# Patient Record
Sex: Male | Born: 1950 | Race: White | Hispanic: No | Marital: Married | Smoking: Never smoker
Health system: Southern US, Community
[De-identification: ages and names within clinical notes are randomized; demographics above are authoritative.]

## PROBLEM LIST (undated history)

## (undated) DIAGNOSIS — G4733 Obstructive sleep apnea (adult) (pediatric): Secondary | ICD-10-CM

## (undated) DIAGNOSIS — E119 Type 2 diabetes mellitus without complications: Secondary | ICD-10-CM

## (undated) DIAGNOSIS — F32A Depression, unspecified: Secondary | ICD-10-CM

## (undated) DIAGNOSIS — F329 Major depressive disorder, single episode, unspecified: Secondary | ICD-10-CM

## (undated) DIAGNOSIS — I2510 Atherosclerotic heart disease of native coronary artery without angina pectoris: Secondary | ICD-10-CM

## (undated) DIAGNOSIS — E785 Hyperlipidemia, unspecified: Secondary | ICD-10-CM

## (undated) DIAGNOSIS — I10 Essential (primary) hypertension: Secondary | ICD-10-CM

## (undated) HISTORY — DX: Type 2 diabetes mellitus without complications: E11.9

## (undated) HISTORY — PX: HERNIA REPAIR: SHX51

## (undated) HISTORY — DX: Atherosclerotic heart disease of native coronary artery without angina pectoris: I25.10

## (undated) HISTORY — DX: Major depressive disorder, single episode, unspecified: F32.9

## (undated) HISTORY — DX: Essential (primary) hypertension: I10

## (undated) HISTORY — DX: Depression, unspecified: F32.A

## (undated) HISTORY — DX: Hyperlipidemia, unspecified: E78.5

## (undated) HISTORY — DX: Obstructive sleep apnea (adult) (pediatric): G47.33

---

## 2010-01-17 ENCOUNTER — Inpatient Hospital Stay (HOSPITAL_COMMUNITY)
Admission: EM | Admit: 2010-01-17 | Discharge: 2010-01-19 | Payer: Self-pay | Source: Home / Self Care | Attending: Cardiology | Admitting: Cardiology

## 2010-01-17 DIAGNOSIS — I214 Non-ST elevation (NSTEMI) myocardial infarction: Secondary | ICD-10-CM | POA: Insufficient documentation

## 2010-01-18 ENCOUNTER — Ambulatory Visit
Admission: AD | Admit: 2010-01-18 | Discharge: 2010-01-18 | Payer: Self-pay | Source: Home / Self Care | Admitting: Cardiology

## 2010-01-18 HISTORY — PX: OTHER SURGICAL HISTORY: SHX169

## 2010-01-20 DIAGNOSIS — E119 Type 2 diabetes mellitus without complications: Secondary | ICD-10-CM | POA: Insufficient documentation

## 2010-01-20 DIAGNOSIS — E785 Hyperlipidemia, unspecified: Secondary | ICD-10-CM | POA: Insufficient documentation

## 2010-01-20 DIAGNOSIS — G4700 Insomnia, unspecified: Secondary | ICD-10-CM | POA: Insufficient documentation

## 2010-01-20 DIAGNOSIS — I10 Essential (primary) hypertension: Secondary | ICD-10-CM | POA: Insufficient documentation

## 2010-01-20 DIAGNOSIS — E291 Testicular hypofunction: Secondary | ICD-10-CM | POA: Insufficient documentation

## 2010-01-20 DIAGNOSIS — I2510 Atherosclerotic heart disease of native coronary artery without angina pectoris: Secondary | ICD-10-CM | POA: Insufficient documentation

## 2010-01-24 ENCOUNTER — Encounter: Payer: Self-pay | Admitting: Physician Assistant

## 2010-01-24 ENCOUNTER — Encounter: Payer: Self-pay | Admitting: Cardiovascular Disease

## 2010-01-24 ENCOUNTER — Telehealth: Payer: Self-pay | Admitting: Physician Assistant

## 2010-01-24 DIAGNOSIS — IMO0002 Reserved for concepts with insufficient information to code with codable children: Secondary | ICD-10-CM | POA: Insufficient documentation

## 2010-01-31 ENCOUNTER — Ambulatory Visit: Payer: Self-pay | Admitting: Physician Assistant

## 2010-01-31 ENCOUNTER — Encounter (HOSPITAL_COMMUNITY)
Admission: RE | Admit: 2010-01-31 | Discharge: 2010-03-15 | Payer: Self-pay | Source: Home / Self Care | Attending: Cardiovascular Disease | Admitting: Cardiovascular Disease

## 2010-02-03 ENCOUNTER — Telehealth (INDEPENDENT_AMBULATORY_CARE_PROVIDER_SITE_OTHER): Payer: Self-pay | Admitting: *Deleted

## 2010-02-03 ENCOUNTER — Encounter (HOSPITAL_COMMUNITY)
Admission: RE | Admit: 2010-02-03 | Discharge: 2010-03-15 | Payer: Self-pay | Source: Home / Self Care | Attending: Cardiovascular Disease | Admitting: Cardiovascular Disease

## 2010-02-08 ENCOUNTER — Ambulatory Visit: Payer: Self-pay | Admitting: Cardiovascular Disease

## 2010-02-09 ENCOUNTER — Encounter: Payer: Self-pay | Admitting: Cardiovascular Disease

## 2010-02-11 ENCOUNTER — Encounter: Payer: Self-pay | Admitting: Cardiovascular Disease

## 2010-02-15 ENCOUNTER — Ambulatory Visit
Admission: RE | Admit: 2010-02-15 | Discharge: 2010-02-15 | Payer: Self-pay | Source: Home / Self Care | Attending: Cardiovascular Disease | Admitting: Cardiovascular Disease

## 2010-02-18 LAB — GLUCOSE, CAPILLARY
Glucose-Capillary: 161 mg/dL — ABNORMAL HIGH (ref 70–99)
Glucose-Capillary: 119 mg/dL — ABNORMAL HIGH (ref 70–99)

## 2010-02-22 ENCOUNTER — Ambulatory Visit
Admission: RE | Admit: 2010-02-22 | Discharge: 2010-02-22 | Payer: Self-pay | Source: Home / Self Care | Attending: Cardiovascular Disease | Admitting: Cardiovascular Disease

## 2010-03-03 ENCOUNTER — Encounter: Payer: Self-pay | Admitting: Cardiovascular Disease

## 2010-03-03 ENCOUNTER — Ambulatory Visit
Admission: RE | Admit: 2010-03-03 | Discharge: 2010-03-03 | Payer: Self-pay | Source: Home / Self Care | Attending: Cardiovascular Disease | Admitting: Cardiovascular Disease

## 2010-03-08 ENCOUNTER — Ambulatory Visit
Admission: RE | Admit: 2010-03-08 | Discharge: 2010-03-08 | Payer: Self-pay | Source: Home / Self Care | Attending: Cardiovascular Disease | Admitting: Cardiovascular Disease

## 2010-03-08 ENCOUNTER — Encounter: Payer: Self-pay | Admitting: Cardiovascular Disease

## 2010-03-15 ENCOUNTER — Ambulatory Visit: Admit: 2010-03-15 | Payer: Self-pay | Admitting: Cardiovascular Disease

## 2010-03-15 ENCOUNTER — Inpatient Hospital Stay (HOSPITAL_COMMUNITY)
Admission: EM | Admit: 2010-03-15 | Discharge: 2010-03-16 | DRG: 122 | Disposition: A | Discharge status: Home / Self Care | Payer: BC Managed Care – HMO | Attending: Internal Medicine | Admitting: Internal Medicine

## 2010-03-15 ENCOUNTER — Encounter (INDEPENDENT_AMBULATORY_CARE_PROVIDER_SITE_OTHER): Payer: Self-pay | Admitting: *Deleted

## 2010-03-15 ENCOUNTER — Telehealth: Payer: Self-pay | Admitting: Cardiovascular Disease

## 2010-03-15 DIAGNOSIS — Z9861 Coronary angioplasty status: Secondary | ICD-10-CM

## 2010-03-15 DIAGNOSIS — I10 Essential (primary) hypertension: Secondary | ICD-10-CM | POA: Diagnosis present

## 2010-03-15 DIAGNOSIS — E669 Obesity, unspecified: Secondary | ICD-10-CM | POA: Diagnosis present

## 2010-03-15 DIAGNOSIS — E785 Hyperlipidemia, unspecified: Secondary | ICD-10-CM | POA: Diagnosis present

## 2010-03-15 DIAGNOSIS — I2510 Atherosclerotic heart disease of native coronary artery without angina pectoris: Secondary | ICD-10-CM | POA: Diagnosis present

## 2010-03-15 DIAGNOSIS — Z7982 Long term (current) use of aspirin: Secondary | ICD-10-CM

## 2010-03-15 DIAGNOSIS — E119 Type 2 diabetes mellitus without complications: Secondary | ICD-10-CM | POA: Diagnosis present

## 2010-03-15 DIAGNOSIS — I252 Old myocardial infarction: Secondary | ICD-10-CM

## 2010-03-15 DIAGNOSIS — I214 Non-ST elevation (NSTEMI) myocardial infarction: Principal | ICD-10-CM | POA: Diagnosis present

## 2010-03-15 LAB — CK TOTAL AND CKMB (NOT AT ARMC)
CK, MB: 5 ng/mL — ABNORMAL HIGH (ref 0.3–4.0)
Relative Index: 3 — ABNORMAL HIGH (ref 0.0–2.5)
Total CK: 168 U/L (ref 7–232)

## 2010-03-15 LAB — POCT CARDIAC MARKERS
CKMB, poc: 1.5 ng/mL (ref 1.0–8.0)
CKMB, poc: 1.7 ng/mL (ref 1.0–8.0)
Myoglobin, poc: 58.5 ng/mL (ref 12–200)
Myoglobin, poc: 59.8 ng/mL (ref 12–200)
Troponin i, poc: <0.05 ng/mL (ref 0.00–0.09)
Troponin i, poc: <0.05 ng/mL (ref 0.00–0.09)

## 2010-03-15 LAB — DIFFERENTIAL
Basophils Absolute: 0 10*3/uL (ref 0.0–0.1)
Basophils Relative: 0 % (ref 0–1)
Eosinophils Absolute: 0.1 10*3/uL (ref 0.0–0.7)
Eosinophils Relative: 1 % (ref 0–5)
Lymphocytes Relative: 25 % (ref 12–46)
Lymphs Abs: 2.2 10*3/uL (ref 0.7–4.0)
Monocytes Absolute: 0.7 10*3/uL (ref 0.1–1.0)
Monocytes Relative: 8 % (ref 3–12)
Neutro Abs: 5.8 10*3/uL (ref 1.7–7.7)
Neutrophils Relative %: 65 % (ref 43–77)

## 2010-03-15 LAB — TROPONIN I: Troponin I: 0.11 ng/mL — ABNORMAL HIGH (ref 0.00–0.06)

## 2010-03-15 LAB — URINALYSIS, ROUTINE W REFLEX MICROSCOPIC
Bilirubin Urine: NEGATIVE
Hgb urine dipstick: NEGATIVE
Ketones, ur: NEGATIVE mg/dL
Leukocytes, UA: NEGATIVE
Nitrite: NEGATIVE
Protein, ur: 30 mg/dL — AB
Specific Gravity, Urine: 1.011 (ref 1.005–1.030)
Urine Glucose, Fasting: NEGATIVE mg/dL
Urobilinogen, UA: 0.2 mg/dL (ref 0.0–1.0)
pH: 7.5 (ref 5.0–8.0)

## 2010-03-15 LAB — CBC
HCT: 44.3 % (ref 39.0–52.0)
Hemoglobin: 14.3 g/dL (ref 13.0–17.0)
MCH: 28 pg (ref 26.0–34.0)
MCHC: 32.3 g/dL (ref 30.0–36.0)
MCV: 86.9 fL (ref 78.0–100.0)
Platelets: 187 10*3/uL (ref 150–400)
RBC: 5.1 MIL/uL (ref 4.22–5.81)
RDW: 13.5 % (ref 11.5–15.5)
WBC: 8.8 10*3/uL (ref 4.0–10.5)

## 2010-03-15 LAB — URINE MICROSCOPIC-ADD ON

## 2010-03-15 LAB — COMPREHENSIVE METABOLIC PANEL
ALT: 49 U/L (ref 0–53)
AST: 26 U/L (ref 0–37)
Albumin: 3.7 g/dL (ref 3.5–5.2)
Alkaline Phosphatase: 55 U/L (ref 39–117)
BUN: 7 mg/dL (ref 6–23)
CO2: 27 mEq/L (ref 19–32)
Calcium: 9.1 mg/dL (ref 8.4–10.5)
Chloride: 105 mEq/L (ref 96–112)
Creatinine, Ser: 0.67 mg/dL (ref 0.4–1.5)
GFR calc Af Amer: >60 mL/min (ref >60)
GFR calc non Af Amer: >60 mL/min (ref >60)
Glucose, Bld: 106 mg/dL — ABNORMAL HIGH (ref 70–99)
Potassium: 3.9 mEq/L (ref 3.5–5.1)
Sodium: 138 mEq/L (ref 135–145)
Total Bilirubin: 0.6 mg/dL (ref 0.3–1.2)
Total Protein: 6.3 g/dL (ref 6.0–8.3)

## 2010-03-15 LAB — PROTIME-INR
INR: 1.08 (ref 0.00–1.49)
INR: 1.16 (ref 0.00–1.49)
Prothrombin Time: 14.2 seconds (ref 11.6–15.2)
Prothrombin Time: 15 seconds (ref 11.6–15.2)

## 2010-03-15 LAB — GLUCOSE, CAPILLARY
Glucose-Capillary: 75 mg/dL (ref 70–99)
Glucose-Capillary: 136 mg/dL — ABNORMAL HIGH (ref 70–99)
Glucose-Capillary: 119 mg/dL — ABNORMAL HIGH (ref 70–99)

## 2010-03-15 LAB — TSH: TSH: 1.051 u[IU]/mL (ref 0.350–4.500)

## 2010-03-15 LAB — CARDIAC PANEL(CRET KIN+CKTOT+MB+TROPI)
CK, MB: 5 ng/mL — ABNORMAL HIGH (ref 0.3–4.0)
CK, MB: 4 ng/mL (ref 0.3–4.0)
Relative Index: 3.2 — ABNORMAL HIGH (ref 0.0–2.5)
Relative Index: 3 — ABNORMAL HIGH (ref 0.0–2.5)
Total CK: 156 U/L (ref 7–232)
Total CK: 132 U/L (ref 7–232)
Troponin I: 0.13 ng/mL — ABNORMAL HIGH (ref 0.00–0.06)
Troponin I: 0.09 ng/mL — ABNORMAL HIGH (ref 0.00–0.06)

## 2010-03-15 LAB — APTT
aPTT: 31 seconds (ref 24–37)
aPTT: 92 seconds — ABNORMAL HIGH (ref 24–37)

## 2010-03-15 LAB — HEMOGLOBIN A1C
Hgb A1c MFr Bld: 6.2 % — ABNORMAL HIGH (ref <5.7)
Mean Plasma Glucose: 131 mg/dL — ABNORMAL HIGH (ref <117)

## 2010-03-15 LAB — HEPARIN LEVEL (UNFRACTIONATED): Heparin Unfractionated: 0.15 IU/mL — ABNORMAL LOW (ref 0.30–0.70)

## 2010-03-16 ENCOUNTER — Encounter: Payer: Self-pay | Admitting: Cardiovascular Disease

## 2010-03-16 ENCOUNTER — Observation Stay (HOSPITAL_COMMUNITY): Payer: BC Managed Care – HMO

## 2010-03-16 DIAGNOSIS — I214 Non-ST elevation (NSTEMI) myocardial infarction: Secondary | ICD-10-CM

## 2010-03-16 DIAGNOSIS — I2510 Atherosclerotic heart disease of native coronary artery without angina pectoris: Secondary | ICD-10-CM

## 2010-03-16 LAB — LIPID PANEL
HDL: 31 mg/dL — ABNORMAL LOW (ref >39)
Total CHOL/HDL Ratio: 2.9 RATIO
VLDL: 22 mg/dL (ref 0–40)

## 2010-03-16 LAB — COMPREHENSIVE METABOLIC PANEL
ALT: 42 U/L (ref 0–53)
AST: 24 U/L (ref 0–37)
Albumin: 3.5 g/dL (ref 3.5–5.2)
CO2: 26 mEq/L (ref 19–32)
Calcium: 8.8 mg/dL (ref 8.4–10.5)
GFR calc Af Amer: >60 mL/min (ref >60)
Sodium: 140 mEq/L (ref 135–145)
Total Protein: 6.1 g/dL (ref 6.0–8.3)

## 2010-03-16 LAB — CBC
MCH: 26.7 pg (ref 26.0–34.0)
MCV: 88.3 fL (ref 78.0–100.0)
Platelets: 177 10*3/uL (ref 150–400)
RBC: 4.95 MIL/uL (ref 4.22–5.81)

## 2010-03-16 LAB — CARDIAC PANEL(CRET KIN+CKTOT+MB+TROPI)
CK, MB: 2.9 ng/mL (ref 0.3–4.0)
Relative Index: INVALID (ref 0.0–2.5)
Total CK: 93 U/L (ref 7–232)

## 2010-03-16 LAB — GLUCOSE, CAPILLARY: Glucose-Capillary: 110 mg/dL — ABNORMAL HIGH (ref 70–99)

## 2010-03-17 LAB — POCT ACTIVATED CLOTTING TIME: Activated Clotting Time: 228 seconds

## 2010-03-17 NOTE — Progress Notes (Signed)
Summary: sore and bruising at cath site  Phone Note Call from Patient Call back at Home Phone [phone] Call back at [phone]   Caller: Patient Summary of Call: Pt is sore and having bruising at cath site Initial call taken by: Judie Grieve,  January 24, 2010 2:15 PM  Follow-up for Phone Call        Pt. stated that his hematoma  on his right wrist has gotten larger since he was discharged. He states that it is firm to the touch. It is slightly painful but very sore. I told him that it was probably okay but since he is supposed to see the PA 12/19 then we could bring him in the office today for that f.u visit so he can eval. his cath site. Pt. will come into the office today @ 3pm to see Tereso Newcomer. Whitney Maeola Sarah RN  January 24, 2010 2:29 PM  Follow-up by: Whitney Maeola Sarah RN,  January 24, 2010 2:30 PM

## 2010-03-17 NOTE — Progress Notes (Signed)
Summary: Records Request  Faxed OV & EKG to [facility] ([phone]). Debby Freiberg  February 03, 2010 10:16 AM

## 2010-03-17 NOTE — Miscellaneous (Signed)
Summary: [facility] Cardiac Progress Report    Cardiac Progress Report   Imported By: Roderic Ovens 03/01/2010 13:31:45  _____________________________________________________________________  External Attachment:    Type:   Image     Comment:   External Document

## 2010-03-17 NOTE — Assessment & Plan Note (Signed)
Summary: EPH .wa   Visit Type:  EPH Primary Provider:  Tally Joe, MD  CC:  hematoma on right hand....denies any other complaints today.  History of Present Illness: Primary Cardiologist:  Dr. [name]  [name] is a 60 yo male with a h/o DM2, HTN and HLP who presented to [facility] 12/5 with a NSTEMI.  He had a 95% proximal LAD lesion treated with a Promus DES.  There was 80% D1 stenosis prior to PCI and the Dx remained patent post stenting.  He had mild to mod residual stenosis in the CFX and RCA.  EF was preserved at 60%.  He was due for an appt to f/u next week.  He called in with concerns at his right radial site and was added on to my schedule.  He noted some bruising in his right wrist yesterday with some mild discomfort with light touch only over this area.  He denies severe pain, cold fingers or numbness.  He denies bleeding or discharge.  He denies chest pain, dyspnea, syncope, palpitations, orhtopnea or PND or edema.  He has not yet been contacted for cardiac rehab.   Current Medications (verified): 1)  Aspirin 81 Mg Tabs (Aspirin) .... Take 1 Tablet By Mouth Once A Day 2)  Lisinopril 5 Mg Tabs (Lisinopril) .... Take 1 Tablet By Mouth Once A Day 3)  Metoprolol Tartrate 50 Mg Tabs (Metoprolol Tartrate) .... Take 1 Tablet By Mouth Two Times A Day 4)  Nitrostat 0.4 Mg Subl (Nitroglycerin) .... Take One Under Tongue As Needed For Chest Pain; May Repeat Every 5 Mins X 2 5)  Effient 10 Mg Tabs (Prasugrel Hcl) .... Take 1 Tablet By Mouth Once A Day 6)  Crestor 40 Mg Tabs (Rosuvastatin Calcium) .... Take 1 Tablet By Mouth Once A Day For Cholesterol 7)  Ambien 10 Mg Tabs (Zolpidem Tartrate) .... Take 1 Tab By Mouth At Bedtime As Needed 8)  Androgel Pump 1.25 Gm/act (1%) Gel (Testosterone) .... As Directed 9)  Fish Oil 1000 Mg Caps (Omega-3 Fatty Acids) .Marland Kitchen.. 1 Cap Once Daily 10)  Multivitamins  Tabs (Multiple Vitamin) .... Take 1 Tablet By Mouth Once A Day 11)  Penlac  8 % Soln (Ciclopirox) .... Apply As Directed Once Daily As Needed  Allergies (verified): No Known Drug Allergies  Past History:  Past Medical History: Reviewed history from 01/20/2010 and no changes required. CAD   a.  s/p NSTEMI 12.5.2011: Tx with Promus DES to prox. LAD   b.  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   He denies any tobacco or drug use.  He may have one alcoholic drink per week.   Review of Systems       As per  the HPI.  All other systems reviewed and negative.   Vital Signs:  Patient profile:   60 year old male Height:      71 inches Weight:      267 pounds BMI:     37.37 Pulse rate:   82 / minute Pulse rhythm:   irregular Resp:     16 per minute BP sitting:   134 / 83  (left arm) Cuff size:   large  Vitals Entered By: Danielle Rankin, CMA (January 24, 2010 3:27 PM)  Physical Exam  General:  Well nourished, well developed, in no acute  distress HEENT: normal Neck: no JVD Cardiac:  normal S1, S2; RRR; no murmur Lungs:  clear to auscultation bilaterally, no wheezing, rhonchi or rales Abd: soft, nontender, no hepatomegaly Ext: no edema; R radial site with small hematoma; no bruit Vascular: no carotid  bruits; right radial pulse 2+; cap refill in right hand < 1 sec. Skin: warm and dry Neuro:  CNs 2-12 intact, no focal abnormalities noted    EKG  Procedure date:  01/24/2010  Findings:      Normal Sinus Rhythm Heart rate 82 Normal axis Insignificant Q wave in lead 3  Poor R wave progression Nonspecific ST-T wave changes  Impression & Recommendations:  Problem # 1:  ACUT MI SUBENDOCARDIAL INFARCT EPIS CARE UNS (ICD-410.70)  Doing well post NSTEMI. We will make sure he gets set up for cardiac rehab. He will try to go back to work at 1/2 time and increase as tolerated.  Problem # 2:  CORONARY  ATHEROSCLEROSIS NATIVE CORONARY ARTERY (ICD-414.01)  Continue ASA and Effient.  I explained the importance of remaining on dual anti-platelet therapy.  Problem # 3:  ESSENTIAL HYPERTENSION, BENIGN (ICD-401.1)  Would like to see his HR lower. Will increase his metoprolol to 50 mg 1 1/2 tablets two times a day.  Problem # 4:  HYPERLIPIDEMIA (ICD-272.4) Check FLP and LFTs at follow up visit.  His updated medication list for this problem includes:    Crestor 40 Mg Tabs (Rosuvastatin calcium) .Marland Kitchen... Take 1 tablet by mouth once a day for cholesterol  Problem # 5:  DIABETES MELLITUS, TYPE II (ICD-250.00) F/u with PCP.  Problem # 6:  HEMATOMA COMPLICATING A PROCEDURE NEC (ZOX-096.04) He has a small hematoma over the radial site with surrounding ecchymoses.  This appears stable without evidence of pseudoaneurysm. Dr. Excell Seltzer also looked at his wrist today. He will continue to monitor and follow up as needed.  Patient Instructions: 1)  Your physician recommends that you schedule a follow-up appointment in: 6 weeks with Dr. Excell Seltzer. 2)  Your physician recommends that you return for a FASTING lipid profile:as well a liver panel in 6 weeks when you return for your visit with Dr. Excell Seltzer 3)  Your physician has recommended you make the following change in your medication: START TTAKING METOPROLOL TART. 50 MG 1 1/2 TAB two times a day NEW RX SENT IN TODAY. Prescriptions: METOPROLOL TARTRATE 50 MG TABS (METOPROLOL TARTRATE) Take 1 and 1/2  tablets by mouth two times a day  #90 x 5   Entered and Authorized by:   Tereso Newcomer PA-C   Signed by:   Tereso Newcomer PA-C on 01/24/2010   Method used:   Electronically to        [facility]  [phone]* (retail)       [address]       [place], [place]  [address]       Ph: [phone] or [phone]       Fax: [phone]   RxID:   [id number]

## 2010-03-17 NOTE — Miscellaneous (Signed)
Summary: [facility] Physician Order/Treatment Plan   [facility] Physician Order/Treatment Plan   Imported By: Roderic Ovens 02/08/2010 16:34:28  _____________________________________________________________________  External Attachment:    Type:   Image     Comment:   External Document

## 2010-03-17 NOTE — Letter (Signed)
Summary: [facility]  Cardiac & Pulm Rehab   Imported By: Marylou Mccoy 02/23/2010 17:21:59  _____________________________________________________________________  External Attachment:    Type:   Image     Comment:   External Document

## 2010-03-18 ENCOUNTER — Ambulatory Visit: Payer: Self-pay | Admitting: Cardiovascular Disease

## 2010-03-18 ENCOUNTER — Ambulatory Visit: Admit: 2010-03-18 | Payer: Self-pay | Admitting: Cardiovascular Disease

## 2010-03-18 ENCOUNTER — Encounter (HOSPITAL_COMMUNITY): Payer: BC Managed Care – HMO

## 2010-03-21 ENCOUNTER — Encounter (HOSPITAL_COMMUNITY): Payer: BC Managed Care – HMO | Attending: Cardiovascular Disease

## 2010-03-21 DIAGNOSIS — E785 Hyperlipidemia, unspecified: Secondary | ICD-10-CM | POA: Insufficient documentation

## 2010-03-21 DIAGNOSIS — I214 Non-ST elevation (NSTEMI) myocardial infarction: Secondary | ICD-10-CM | POA: Insufficient documentation

## 2010-03-21 DIAGNOSIS — I2510 Atherosclerotic heart disease of native coronary artery without angina pectoris: Secondary | ICD-10-CM | POA: Insufficient documentation

## 2010-03-21 DIAGNOSIS — Z7982 Long term (current) use of aspirin: Secondary | ICD-10-CM | POA: Insufficient documentation

## 2010-03-21 DIAGNOSIS — I10 Essential (primary) hypertension: Secondary | ICD-10-CM | POA: Insufficient documentation

## 2010-03-21 DIAGNOSIS — E119 Type 2 diabetes mellitus without complications: Secondary | ICD-10-CM | POA: Insufficient documentation

## 2010-03-21 DIAGNOSIS — Z9861 Coronary angioplasty status: Secondary | ICD-10-CM | POA: Insufficient documentation

## 2010-03-21 DIAGNOSIS — Z5189 Encounter for other specified aftercare: Secondary | ICD-10-CM | POA: Insufficient documentation

## 2010-03-23 ENCOUNTER — Encounter (HOSPITAL_COMMUNITY): Payer: BC Managed Care – HMO

## 2010-03-23 ENCOUNTER — Other Ambulatory Visit: Payer: Self-pay

## 2010-03-23 NOTE — Progress Notes (Signed)
Summary: pt had chest pain in the last week  Phone Note Call from Patient Call back at [phone]   Caller: Patient Reason for Call: Talk to Nurse, Talk to Doctor Summary of Call: pt had chest pain in the last week chills and SOB and elevated b/p. He has an appt Friday with Dr. Copper but was wondering if he needed to be seen sooner.  Follow-up for Phone Call        Spoke with pt who reports chest pain  4-5 different times in past week.  Pt usually starts with shaking chills and then has chest pain. Describes pain as similiar to when he had MI but less intense. Pain located in central chest area. He does report some SOB with pain.  Not sure if he has a fever but has had persistent cough for last week or so.  States blood pressure was elevated yesterday at Cardiac Rehab--150/80-90.  This AM blood pressure is 159/90.  Has appt. this Friday with Dr. Excell Seltzer. Will discuss with Tereso Newcomer, PA who saw pt in December. Follow-up by: Dossie Arbour, RN, BSN,  March 15, 2010 8:36 AM  Additional Follow-up for Phone Call Additional follow up Details #1::        Recommend he go to ED with symptoms that remind him of his recent MI. Additional Follow-up by: Tereso Newcomer PA-C,  March 15, 2010 8:43 AM    Additional Follow-up for Phone Call Additional follow up Details #2::    Pt. given recommendations per Tereso Newcomer, PA.  He is not having pain at present and lives 5 minutes from hospital so wife will take him.   Will notify ED. Follow-up by: Dossie Arbour, RN, BSN,  March 15, 2010 8:48 AM

## 2010-03-23 NOTE — Letter (Signed)
Summary: ER Notification  Architectural technologist, Main Office  [address]   [place], [place] [address]   Phone: [phone]  Fax: [phone]    March 15, 2010 8:50 AM  [name]  The above referenced patient has been advised to report directly to the Emergency Room. Please see below for more information:  Dx:  chest pain._     Sales executive  _____X__________ or EMS:  ________________   Orders:  Yes ______ or No  __X_____   Notify upon arrival:     Trish [phone]        Thank you,    Architectural technologist Staff

## 2010-03-25 ENCOUNTER — Encounter (HOSPITAL_COMMUNITY): Payer: BC Managed Care – HMO

## 2010-03-28 ENCOUNTER — Encounter (HOSPITAL_COMMUNITY): Payer: BC Managed Care – HMO

## 2010-03-30 ENCOUNTER — Encounter (HOSPITAL_COMMUNITY): Payer: BC Managed Care – HMO

## 2010-03-31 ENCOUNTER — Encounter: Payer: Self-pay | Admitting: Physician Assistant

## 2010-03-31 ENCOUNTER — Encounter (INDEPENDENT_AMBULATORY_CARE_PROVIDER_SITE_OTHER): Payer: BC Managed Care – HMO | Admitting: Physician Assistant

## 2010-03-31 DIAGNOSIS — G4733 Obstructive sleep apnea (adult) (pediatric): Secondary | ICD-10-CM | POA: Insufficient documentation

## 2010-03-31 DIAGNOSIS — I2510 Atherosclerotic heart disease of native coronary artery without angina pectoris: Secondary | ICD-10-CM

## 2010-03-31 DIAGNOSIS — R5383 Other fatigue: Secondary | ICD-10-CM | POA: Insufficient documentation

## 2010-03-31 DIAGNOSIS — R5381 Other malaise: Secondary | ICD-10-CM | POA: Insufficient documentation

## 2010-03-31 DIAGNOSIS — R079 Chest pain, unspecified: Secondary | ICD-10-CM

## 2010-04-01 ENCOUNTER — Encounter (HOSPITAL_COMMUNITY): Payer: BC Managed Care – HMO

## 2010-04-04 ENCOUNTER — Encounter (HOSPITAL_COMMUNITY): Payer: BC Managed Care – HMO

## 2010-04-05 ENCOUNTER — Ambulatory Visit: Payer: Self-pay | Admitting: Cardiovascular Disease

## 2010-04-06 ENCOUNTER — Encounter (HOSPITAL_COMMUNITY): Payer: BC Managed Care – HMO

## 2010-04-06 NOTE — H&P (Signed)
NAME:  [name], FUSSELL NO.:  [id number]  MEDICAL RECORD NO.:  [id number]          PATIENT TYPE:  INP  LOCATION:  3742                         FACILITY:  [facility]  PHYSICIAN:  Bevelyn Buckles. Della Homan, MDDATE OF BIRTH:  October 06, 1950  DATE OF ADMISSION:  03/15/2010 DATE OF DISCHARGE:                             HISTORY & PHYSICAL   PRIMARY CARDIOLOGIST:  Veverly Fells. Excell Seltzer, MD  PRIMARY CARE PROVIDER:  Tally Joe, MD  PATIENT PROFILE:  A 60 year old male with history of non-ST-elevation MI and drug-eluting stent placement to the LAD in December 2011 presents with atypical chest pain.  PROBLEMS: 1. Chest pain/coronary artery disease.     a.     Status post non-ST-elevation myocardial infarction January 18, 2010.     b.     January 19, 2010, cardiac catheterization showing left main      normal.  Left anterior descending 95% proximal extending into the      bifurcation of the first diagonal.  First diagonal had 80% ostial      stenosis.  The second diagonal was subtotally occluded.      Circumflex had 30% mid stenosis.  Right coronary artery has a 30%      to 40% proximal stenosis and 50% distal stenosis.  Ejection      fraction was 60%.  The left anterior descending was stented with a      3.5 x 20 mm PROMUS Element plus drug-eluting stent. 2. Hypertension. 3. Hyperlipidemia. 4. Diabetes mellitus, diet controlled. 5. Overweight.  ALLERGIES:  No known drug allergies.  HISTORY OF PRESENT ILLNESS:  A 60 year old male with the above problem list.  Since his non-ST-elevation MI drug-eluting stent in December, he had been doing relatively well, working on cardiac rehab without significant limitations.  Approximately 2 weeks ago, he began to experience occasional episodes of profound chills followed by 4/10 substernal chest pressure sometimes associated with shortness of breath occurring almost exclusively at rest lasting hours at a time and resolving only once he  puts on a warm coat.  Once he is warm, chills and chest pain resolve completely.  Symptoms are similar to prior angina only and that it feels like pressure, but prior angina was left-sided chest pressure, which was more severe and associated with nausea, diaphoresis, dyspnea, and left arm radiation.  He has not had anything like that over the past few weeks.  He has probably had about 10 episodes of chills and chest discomfort over the past 2 weeks, however, has only ever had one episode of chest discomfort while at cardiac rehab and that occurred earlier this week and was improved by simply putting his right arm at his side while on the elliptical without having to reduce his level of activity.  Yesterday, the patient noted that his blood pressure was running in the 160s.  It was the same way this morning.  Because of these intermittent symptoms of chest pain, now with hypertension, he called the office this morning and was advised to present to the ED.  Currently he is pain free.  Pressures  are in the 150s.  HOME MEDICATIONS: 1. Aspirin 81 mg daily. 2. Lisinopril 5 mg daily. 3. Lopressor 75 mg b.i.d. 4. Nitroglycerin p.r.n. 5. Effient 10 mg daily. 6. Crestor 40 mg daily. 7. Ambien 10 mg at bedtime p.r.n. 8. AndroGel Pump 1.25 g as directed. 9. Fish oil 1000 mg daily. 10.Multivitamin daily. 11.Penlac 8% solution, study drug now completed.  FAMILY HISTORY:  His father had CABG in his 52s, otherwise negative for early CAD.  SOCIAL HISTORY:  The patient lives locally with his wife.  He is a Production designer, theatre/television/film at a [employer].  Denies tobacco or drug use.  He has about one drink a week.  He is exercising 3 times a week in cardiac rehab as above.  REVIEW OF SYSTEMS:  Positive for chills with associated chest pain. Occasionally has dyspnea, but overall feels better when he exercises. He has a history of diet-controlled diabetes.  He is a full code. Otherwise, all systems reviewed and  negative.  PHYSICAL EXAMINATION:  VITAL SIGNS:  Temperature 98.5, heart rate 63, respirations 20, blood pressure 152/92, pulse ox 98% on room air. GENERAL:  Pleasant white male in no acute distress.  Awake, alert and oriented x3.  Has a normal affect. HEENT:  Normal.  Nares grossly intact.  Nonfocal. SKIN:  Warm and dry without lesions or masses. NECK:  Supple without bruits or JVD. LUNGS:  Respirations are regular and unlabored.  Clear to auscultation. CARDIAC:  Regular S1 and S2.  No S3, S4, or murmurs. ABDOMEN:  Round, soft, nontender, nondistended.  Bowel sounds present x4. EXTREMITIES:  Warm, dry, and pink.  No clubbing, cyanosis, or edema. Dorsalis pedis and posterior tibial pulses 2+ and equal bilaterally.  Chest x-ray is pending.  EKG shows sinus rhythm, rate of 62, no acute ST- T changes.  Lab work is pending.  ASSESSMENT/PLAN: 1. Chest pain fairly atypical, but does have a recently placed stent.     We will plan to admit and cycle cardiac markers.  Of note, the     patient is enrolled in the Tie Squared Trial and is due to have a     catheterization in the coming week or so.  We will touch base with     the research team and if appropriate from a research standpoint, we     would pursue catheterization.  If however, the timing is not right     for his catheterization based on the research trial, and his     enzymes are negative, we will consider Myoview.  Continue aspirin,     Effient, beta-blocker, ACE inhibitor, and statin. 2. Hypertension.  Blood pressures have been running high in the past 2     days.  We will switch him from lisinopril to ramipril at 5 mg daily     to see if this helps.  Plenty of room for adjustment of his meds at     this time. 3. Hyperlipidemia.  Continue statin therapy.  Check lipids and LFTs. 4. Diet-controlled diabetes mellitus.  Add sliding scale insulin.     Consider switching metoprolol to carvedilol.  Continue simvastatin. 5. ? depression.   The patient reports that since his MI he has been     feeling on the edge and thinks that cardiac rehab has been helping     with his depression related to this change in his life.  He would     be willing to try Zoloft, we will start this at  25 mg daily and     plan to titrate to 50 mg daily in a few days if the tolerates it.     Nicolasa Ducking, ANP   ______________________________ Bevelyn Buckles. Clemmie Buelna, MD    CB/MEDQ  D:  03/15/2010  T:  03/16/2010  Job:  [id number]  Electronically Signed by Nicolasa Ducking ANP on 03/18/2010 04:25:51 PM Electronically Signed by Arvilla Meres MD on 04/06/2010 01:55:36 PM

## 2010-04-06 NOTE — Assessment & Plan Note (Signed)
Summary: eph/post cath/per nikki/lg   Visit Type:  Post-hospital Primary Provider:  Tally Joe, MD  CC:  No complaints.  History of Present Illness: Primary Cardiologist:  Dr. [name]  [name] is a 60 yo male with a h/o DM2, HTN and HLP who presented to [facility] 12/5 with a NSTEMI.  He had a 95% proximal LAD lesion treated with a Promus DES.  There was 80% D1 stenosis prior to PCI and the Dx remained patent post stenting.  He had mild to mod residual stenosis in the CFX and RCA.  EF was preserved at 60%.  He presented to [facility] on 1/31 with recurrent chest pain.  His troponins were mildly elevated (0.13. 0.09, 0.09).  Cardiac cath was done on 03/16/10 and demonstrated a patent stent in the LAD, the first diagonal was jailed with an 80% stenosis and the second diagonal was totally occluded; circumflex 30-40%; proximal RCA 40%, distal RCA 40%.  There was no significant change.  He was a part of the Chi Squared trial and was due for repeat cardiac catheterization as well.  Medical therapy was continued.  He returns for followup.  The patient notes an occasional sharp pain in his chest that lasts a second or 2.  He denies any exertional symptoms.  Denies any shortness of breath.  He denies syncope.  He does note a significant history of snoring.  He was told in the hospital that he had some apneic episodes.  He does admit to daytime hypersomnolence.  He has never been tested for sleep apnea.  He was placed on sertraline in the hospital due to possible depression.  Current Medications (verified): 1)  Aspirin 81 Mg Tabs (Aspirin) .... Take 1 Tablet By Mouth Once A Day 2)  Lisinopril 5 Mg Tabs (Lisinopril) .... Take 1 Tablet By Mouth Once A Day 3)  Metoprolol Tartrate 50 Mg Tabs (Metoprolol Tartrate) .... Take 1 and 1/2  Tablets By Mouth Two Times A Day 4)  Nitrostat 0.4 Mg Subl (Nitroglycerin) .... Take One Under Tongue As Needed For Chest Pain; May Repeat Every 5 Mins X 2 5)   Effient 10 Mg Tabs (Prasugrel Hcl) .... Take 1 Tablet By Mouth Once A Day 6)  Crestor 40 Mg Tabs (Rosuvastatin Calcium) .... Take 1 Tablet By Mouth Once A Day For Cholesterol 7)  Ambien 10 Mg Tabs (Zolpidem Tartrate) .... Take 1 Tab By Mouth At Bedtime As Needed 8)  Androgel Pump 1.25 Gm/act (1%) Gel (Testosterone) .... 3 or 4 X A Week 9)  Fish Oil 1000 Mg Caps (Omega-3 Fatty Acids) .Marland Kitchen.. 1 Cap Once Daily 10)  Multivitamins  Tabs (Multiple Vitamin) .... 3 or 4 X A Week 11)  Sertraline Hcl 25 Mg Tabs (Sertraline Hcl) .... Take 1 Tablet By Mouth Once A Day  Allergies (verified): No Known Drug Allergies  Past History:  Past Medical History: CAD   a.  s/p NSTEMI 12.5.2011: Tx with Promus DES to prox. LAD   b.  cath 12.5.2011: residulal - D1 80%; AVCFx 50%; pRCA 30-40%; dRCA 50%; EF 60%   c.  cath 03/16/10: pLAD stent ok; D1 80% (jailed); D2 occluded; CFX 30-40%; pRCA 40,         dRCA 40% - no change - med Rx Diabetes Type 2 Hyperlipidemia Hypertension  Review of Systems       As per  the HPI.  All other systems reviewed and negative.   Vital Signs:  Patient profile:  60 year old male Height:      71 inches Weight:      256.25 pounds BMI:     35.87 Pulse rate:   62 / minute Pulse rhythm:   regular Resp:     18 per minute BP sitting:   122 / 88  (left arm) Cuff size:   large  Vitals Entered By: Vikki Ports (March 31, 2010 9:09 AM)  Physical Exam  General:  Well nourished, well developed, in no acute distress HEENT: normal Neck: no JVD at 90 Cardiac:  normal S1, S2; RRR; no murmur Lungs:  clear to auscultation bilaterally, no wheezing, rhonchi or rales Abd: soft, nontender, no hepatomegaly Ext: no edema; right radial site without hematoma or bruit Endo: No thyromegaly Skin: warm and dry Neuro:  CNs 2-12 intact, no focal abnormalities noted    EKG  Procedure date:  03/31/2010  Findings:      normal sinus rhythm Heart rate 62 Normal axis Poor R-wave  progression Nonspecific ST-T wave changes  Impression & Recommendations:  Problem # 1:  CORONARY ATHEROSCLEROSIS NATIVE CORONARY ARTERY (ICD-414.01) He is doing well without symptoms consistent with angina.  He has an occasional chest pain.  He does not describe any symptoms of acid reflux.  At this point in time, I feel he probably has some musculoskeletal pain.  There may also be a component of anxiety.  Reassurance was provided.  He's been asked to continue to followup with cardiac rehabilitation.  He will continue on aspirin and Effient and statin therapy.  He will followup with Dr. Excell Seltzer in the next 3 months.  Orders: EKG w/ Interpretation (93000)  Problem # 2:  FATIGUE (ICD-780.79) He describes symptoms that are very consistent with sleep apnea.  I will refer him to sleep medicine for further evaluation.  Problem # 3:  HYPERLIPIDEMIA (ICD-272.4) His LDL was 36 and his ALT was 42 while he was recently hospitalized.  Continuing Crestor. His updated medication list for this problem includes:    Crestor 40 Mg Tabs (Rosuvastatin calcium) .Marland Kitchen... Take 1 tablet by mouth once a day for cholesterol  Problem # 4:  ESSENTIAL HYPERTENSION, BENIGN (ICD-401.1) Controlled.  Other Orders: Sleep Disorder Referral (Sleep Disorder)  Patient Instructions: 1)  Your physician wants you to follow-up in: 3 months with Dr. Excell Seltzer.  You will receive a reminder letter in the mail two months in advance. If you don't receive a letter, please call our office to schedule the follow-up appointment. 2)  Your physician has recommended that you have a sleep study with Dr. Craige Cotta or Dr. Marchelle Gearing for sleep apnea.  This test records several body functions during sleep, including:  brain activity, eye movement, oxygen and carbon dioxide blood levels, heart rate and rhythm, breathing rate and rhythm, the flow of air through your mouth and nose, snoring, body muscle movements, and chest and belly movement. 3)  Your physician  recommends that you continue on your current medications as directed. Please refer to the Current Medication list given to you today.

## 2010-04-07 NOTE — Procedures (Signed)
NAME:  [name], GENTER NO.:  [id number]  MEDICAL RECORD NO.:  [id number]           PATIENT TYPE:  LOCATION:                                 FACILITY:  PHYSICIAN:  Lorine Bears, MD     DATE OF BIRTH:  1950/10/26  DATE OF PROCEDURE: DATE OF DISCHARGE:                           CARDIAC CATHETERIZATION   PRIMARY CARDIOLOGIST:  Veverly Fells. Excell Seltzer, MD  PROCEDURES PERFORMED: 1. Left heart catheterization. 2. Coronary angiography.  INDICATIONS AND CLINICAL HISTORY:  Mr. Harvel is a 60 year old gentleman with known history of coronary artery disease.  He is status post angioplasty and a drug-eluting stent placement to proximal LAD in December 2011.  He was enrolled in the Chi-Square study at that time with IVUS to the left circumflex.  He has been attending cardiac rehab. He presented with symptoms of chest pain which was worrisome for unstable angina with atypical symptoms.  Due to his previous cardiac history, cardiac catheterization was recommended.  Risks, benefits, and alternatives were discussed with the patient.  ACCESS:  Right radial.  STUDY DETAILS:  A standard informed consent was obtained.  He was given fentanyl and Versed for sedation.  The right radial area was prepped in a sterile fashion.  It was anesthetized with 1% lidocaine.  A 6-French sheath was placed in the right radial artery.  Verapamil 3 mg was given through the sheath.  Heparin 5000 units was given intravenously.  A JR-4 was used to image the right coronary artery.  It was also used to cross the aortic valve and record pressure and pullback across the aortic valve.  I then used an XB 3.0 guiding catheter with no side holes to image the left main.  It was then decided to proceed with the IVUS study of the left circumflex part of the protocol of the Chi-Square research protocol.  The vessel was wired with an Intuition wire after checking an ACT which was therapeutic. Nitroglycerin 200 mcg  intracoronary was given before starting the imaging.  The images started in the midcircumflex right after the AV groove artery with mechanical pullback.  It was done twice with no complications.  The IVUS and the wire were removed.  Final angiography showed no complications.  The sheath was then removed after removing the guiding catheter.  A TR band was applied with no immediate complications.  STUDY FINDINGS: 1. Hemodynamic findings:  The left ventricular pressure was 136/10     with a left ventricular end-diastolic pressure of 17 mmHg.  Aortic     pressure was 129/75 with a mean pressure of 98 mmHg. 2. Coronary angiography:     a.     Left main coronary artery:  The vessel was normal in size      and free of significant disease.  There was an ostial spasm that      resolved after he was given nitroglycerin.     b.     Left anterior descending artery:  The vessel was large size.      A stent was noted in the proximal segment and it is patent with  no      significant restenosis.  The first diagonal is a medium-sized      vessel and jailed by the stent.  There is an 80% ostial stenosis      which does not seem to be different from after his angioplasty.      The second diagonal is a small-sized branch and distally occluded      likely due to previous embolization as noted on his previous      catheterization.  The rest of the left anterior descending artery      is free of any significant disease.     c.     Left circumflex artery:  The vessel was normal in size and      has minor irregularities without obstructive disease.  The      posterior AV groove artery has 30% to 40% proximal stenosis.  It      supplies a relatively medium-sized posterolateral branch.  First      and second OMs are small size branches.  The third OM is large      size and free of significant disease.     d.     Right coronary artery:  The vessel was large size and      dominant.  There was a 40% proximal  stenosis which is unchanged.      There is also 40% diffuse disease distally.  CONCLUSION: 1. Significant one-vessel coronary artery disease with patent stent in     the LAD.  The first diagonal is jailed by the stent and has 80%     ostial stenosis which does not seem to be different from before.     The territory is relatively small to medium in size. 2. Moderate right coronary artery stenosis which is unchanged. 3. Successful left circumflex IVUS part of the Chi-Square trial with     no complications.  RECOMMENDATIONS:  Recommend continuing medical therapy.  His chest pain is likely noncardiac.     Lorine Bears, MD     MA/MEDQ  D:  03/16/2010  T:  03/17/2010  Job:  [id number]  cc:   Veverly Fells. Excell Seltzer, MD  Electronically Signed by Lorine Bears MD on 04/07/2010 11:23:21 AM

## 2010-04-07 NOTE — Discharge Summary (Signed)
NAME:  [name], [name] NO.:  [id number]  MEDICAL RECORD NO.:  [id number]           PATIENT TYPE:  I  LOCATION:  6522                         FACILITY:  [facility]  PHYSICIAN:  Lorine Bears, MD     DATE OF BIRTH:  22-Jan-1951  DATE OF ADMISSION:  03/15/2010 DATE OF DISCHARGE:  03/16/2010                              DISCHARGE SUMMARY   PRIMARY CARDIOLOGIST:  Veverly Fells. Excell Seltzer, MD  PRIMARY CARE PHYSICIAN:  Tally Joe, MD  DISCHARGE DIAGNOSES: 1. Non-ST-elevation myocardial infarction.     a.     Cardiac catheterization on March 16, 2010:  Significant      one-vessel coronary artery disease with patent left anterior      descending stent.     b.     Status post percutaneous coronary intervention using a      single drug-eluting stent to the left anterior descending on      January 18, 2010.  Left ventricular ejection fraction estimated at      60%. 2. Diabetes mellitus, type 2. 3. Hypertension. 4. Dyslipidemia. 5. Obesity.  ALLERGIES:  No known drug allergies.  PROCEDURES/DIAGNOSTICS PERFORMED DURING HOSPITALIZATION: 1. Left heart catheterization with selective coronary angiography.     a.     Single one-vessel coronary artery disease with patent LAD      stents.  Diagonal-1 with 80% ostial stenosis, a distally occluded      diagonal-2.  Nonobstructive left circumflex disease proximally.      Proximal stenosis 40% in the RCA.  Left main normal. 2. Chest x-ray on March 15, 2010, showing no active cardiopulmonary     abnormalities.  There is asymmetric elevation of the right     hemidiaphragm that is similar to prior exam in December 2011.  REASON FOR HOSPITALIZATION:  This is a 60 year old gentleman with history of non-ST-elevation myocardial infarction and PCI to the LAD in December 2011, who presents with chest pain with atypical symptoms but with recently placed stent to be concerning for unstable angina. The patient's EKG was without acute changes.   He was admitted for further evaluation.  HOSPITAL COURSE:  The patient ruled in for myocardial infarction with elevated troponin of 0.13.  Of note, the patient is enrolled in the Chi- Squared trial and was due to have a catheterization in the coming week or so.  This was discussed with the research team and it appeared to be the appropriate time from a research standpoint, therefore a cardiac catheterization was pursued.  On March 16, 2010, Dr. Kirke Corin brought the patient to the cardiac cath lab where informed consent was obtained.  As above, the patient had significant one-vessel coronary artery disease with patent LAD stent.  There was successful left heart catheterization with IVUS, part of the Chi-Squared trial.  The patient tolerated the procedure well and will be continued on medical therapy that includes aspirin, Effient, beta-blocker, ACE inhibitor, and statin.  The patient's chest pain has resolved.  On day of discharge, Dr. Kirke Corin evaluated the patient and noted him stable for home.  The patient's right radial site was without  signs hematoma.  He was able to ambulate without difficulty.  Of note, the patient did express sitting on the edge since his last myocardial infarction and feels that he may have some depression related to changes in his life.  He was going to try Zoloft, therefore this has been initiated at a low dose and will be titrated at his next office visit.  DISCHARGE LABORATORY DATA:  Sodium 140, potassium 3.7, chloride 105, bicarb 26, BUN 8, creatinine to 0.74.  Cholesterol 89, triglycerides 112, HDL 31, LDL 36.  Troponin-I 0.09, 0.09, 0.13, 0.11; CK-MB 2.9, 4, 5, 6; CK 93, 132, 156, 168.  Hemoglobin 13.2, hematocrit 43.7.  DISCHARGE MEDICATIONS: 1. Ambien CR 12.5 mg 1 tablet daily at bedtime. 2. AndroGel one application topically daily. 3. Aspirin 81 mg daily. 4. Crestor 40 mg daily. 5. Fish oil 1000 mg 1 capsule daily. 6. Lisinopril 5 mg daily. 7.  Multivitamin 1 tablet daily. 8. Metoprolol tartrate 50 mg one-half tablet twice daily. 9. Nitroglycerin sublingual 0.4 mg 1 tablet under tongue every 5     minutes up to three doses as needed for chest pain. 10.Effient 10 mg 1 tablet daily. 11.Zoloft 0.5 mg 1 tablet daily.  DISCHARGE PLANS AND INSTRUCTIONS: 1. The patient will follow up with Tereso Newcomer, PA, for Dr. Excell Seltzer on     March 31, 2010, at 9 a.m. 2. He is to increase activity slowly.  He may shower, no bathing.  He     is not to lift anything for 1 week greater than 5 ounces.  No     sexual activity for 1 week.  He is to keep his cath site clean and     dry and call our office for     any problems.  He is to avoid straining.  He is to stop any     activity that causes chest pain or shortness of breath. 3. He is to continue a low-sodium, heart-healthy diet.  DURATION OF DISCHARGE:  Greater than 30 minutes with physician and physician extender time.     Leonette Monarch, PA-C   ______________________________ Lorine Bears, MD    NB/MEDQ  D:  03/16/2010  T:  03/17/2010  Job:  [id number]  cc:   Veverly Fells. Excell Seltzer, MD Tally Joe, M.D.  Electronically Signed by Alen Blew P.A. on 03/22/2010 06:40:12 AM Electronically Signed by Lorine Bears MD on 04/07/2010 11:20:04 AM

## 2010-04-08 ENCOUNTER — Encounter (HOSPITAL_COMMUNITY): Payer: BC Managed Care – HMO

## 2010-04-11 ENCOUNTER — Encounter (HOSPITAL_COMMUNITY): Payer: BC Managed Care – HMO

## 2010-04-13 ENCOUNTER — Encounter (HOSPITAL_COMMUNITY): Payer: BC Managed Care – HMO

## 2010-04-14 ENCOUNTER — Encounter: Payer: Self-pay | Admitting: Pulmonary Disease

## 2010-04-15 ENCOUNTER — Institutional Professional Consult (permissible substitution) (INDEPENDENT_AMBULATORY_CARE_PROVIDER_SITE_OTHER): Payer: BC Managed Care – HMO | Admitting: Pulmonary Disease

## 2010-04-15 ENCOUNTER — Encounter: Payer: Self-pay | Admitting: Pulmonary Disease

## 2010-04-15 ENCOUNTER — Encounter (HOSPITAL_COMMUNITY): Payer: BC Managed Care – HMO | Attending: Cardiovascular Disease

## 2010-04-15 DIAGNOSIS — E119 Type 2 diabetes mellitus without complications: Secondary | ICD-10-CM | POA: Insufficient documentation

## 2010-04-15 DIAGNOSIS — Z7982 Long term (current) use of aspirin: Secondary | ICD-10-CM | POA: Insufficient documentation

## 2010-04-15 DIAGNOSIS — I2510 Atherosclerotic heart disease of native coronary artery without angina pectoris: Secondary | ICD-10-CM | POA: Insufficient documentation

## 2010-04-15 DIAGNOSIS — G4730 Sleep apnea, unspecified: Secondary | ICD-10-CM

## 2010-04-15 DIAGNOSIS — I214 Non-ST elevation (NSTEMI) myocardial infarction: Secondary | ICD-10-CM | POA: Insufficient documentation

## 2010-04-15 DIAGNOSIS — Z5189 Encounter for other specified aftercare: Secondary | ICD-10-CM | POA: Insufficient documentation

## 2010-04-15 DIAGNOSIS — I10 Essential (primary) hypertension: Secondary | ICD-10-CM | POA: Insufficient documentation

## 2010-04-15 DIAGNOSIS — E785 Hyperlipidemia, unspecified: Secondary | ICD-10-CM | POA: Insufficient documentation

## 2010-04-15 DIAGNOSIS — Z9861 Coronary angioplasty status: Secondary | ICD-10-CM | POA: Insufficient documentation

## 2010-04-18 ENCOUNTER — Encounter (HOSPITAL_COMMUNITY): Payer: BC Managed Care – HMO

## 2010-04-20 ENCOUNTER — Encounter (HOSPITAL_COMMUNITY): Payer: BC Managed Care – HMO

## 2010-04-22 ENCOUNTER — Other Ambulatory Visit: Payer: Self-pay

## 2010-04-22 ENCOUNTER — Encounter (HOSPITAL_COMMUNITY): Payer: BC Managed Care – HMO

## 2010-04-25 ENCOUNTER — Encounter (HOSPITAL_COMMUNITY): Payer: BC Managed Care – HMO

## 2010-04-25 LAB — GLUCOSE, CAPILLARY: Glucose-Capillary: 90 mg/dL (ref 70–99)

## 2010-04-26 LAB — URINALYSIS, ROUTINE W REFLEX MICROSCOPIC
Glucose, UA: NEGATIVE mg/dL
Leukocytes, UA: NEGATIVE
Nitrite: NEGATIVE
Protein, ur: 100 mg/dL — AB
pH: 5.5 (ref 5.0–8.0)

## 2010-04-26 LAB — CARDIAC PANEL(CRET KIN+CKTOT+MB+TROPI)
CK, MB: 36 ng/mL (ref 0.3–4.0)
Total CK: 494 U/L — ABNORMAL HIGH (ref 7–232)
Total CK: 508 U/L — ABNORMAL HIGH (ref 7–232)

## 2010-04-26 LAB — GLUCOSE, CAPILLARY
Glucose-Capillary: 111 mg/dL — ABNORMAL HIGH (ref 70–99)
Glucose-Capillary: 112 mg/dL — ABNORMAL HIGH (ref 70–99)
Glucose-Capillary: 128 mg/dL — ABNORMAL HIGH (ref 70–99)
Glucose-Capillary: 134 mg/dL — ABNORMAL HIGH (ref 70–99)
Glucose-Capillary: 91 mg/dL (ref 70–99)

## 2010-04-26 LAB — BASIC METABOLIC PANEL
BUN: 17 mg/dL (ref 6–23)
CO2: 25 mEq/L (ref 19–32)
CO2: 27 mEq/L (ref 19–32)
Calcium: 8.9 mg/dL (ref 8.4–10.5)
Chloride: 108 mEq/L (ref 96–112)
Creatinine, Ser: 0.73 mg/dL (ref 0.4–1.5)
Creatinine, Ser: 1.17 mg/dL (ref 0.4–1.5)
GFR calc Af Amer: >60 mL/min (ref >60)
GFR calc Af Amer: >60 mL/min (ref >60)

## 2010-04-26 LAB — CK TOTAL AND CKMB (NOT AT ARMC)
Relative Index: 4 — ABNORMAL HIGH (ref 0.0–2.5)
Total CK: 233 U/L — ABNORMAL HIGH (ref 7–232)
Total CK: 269 U/L — ABNORMAL HIGH (ref 7–232)

## 2010-04-26 LAB — HEMOGLOBIN A1C: Hgb A1c MFr Bld: 6.6 % — ABNORMAL HIGH (ref <5.7)

## 2010-04-26 LAB — URINE MICROSCOPIC-ADD ON

## 2010-04-26 LAB — POCT CARDIAC MARKERS
CKMB, poc: 4.6 ng/mL (ref 1.0–8.0)
Troponin i, poc: 0.19 ng/mL — ABNORMAL HIGH (ref 0.00–0.09)
Troponin i, poc: <0.05 ng/mL (ref 0.00–0.09)

## 2010-04-26 LAB — LIPID PANEL
HDL: 30 mg/dL — ABNORMAL LOW (ref >39)
Total CHOL/HDL Ratio: 4.8 RATIO
Triglycerides: 162 mg/dL — ABNORMAL HIGH (ref <150)
VLDL: 32 mg/dL (ref 0–40)

## 2010-04-26 LAB — CBC
HCT: 41 % (ref 39.0–52.0)
Hemoglobin: 13.5 g/dL (ref 13.0–17.0)
MCH: 28.8 pg (ref 26.0–34.0)
MCH: 29.6 pg (ref 26.0–34.0)
MCV: 86.8 fL (ref 78.0–100.0)
MCV: 87.4 fL (ref 78.0–100.0)
MCV: 88.7 fL (ref 78.0–100.0)
Platelets: 171 10*3/uL (ref 150–400)
Platelets: 195 10*3/uL (ref 150–400)
RBC: 4.68 MIL/uL (ref 4.22–5.81)
RBC: 4.69 MIL/uL (ref 4.22–5.81)
RBC: 5.3 MIL/uL (ref 4.22–5.81)
RDW: 13.6 % (ref 11.5–15.5)
WBC: 10.4 10*3/uL (ref 4.0–10.5)

## 2010-04-26 LAB — TROPONIN I
Troponin I: 0.61 ng/mL (ref 0.00–0.06)
Troponin I: 0.63 ng/mL (ref 0.00–0.06)

## 2010-04-26 LAB — DIFFERENTIAL
Basophils Relative: 0 % (ref 0–1)
Eosinophils Absolute: 0.2 10*3/uL (ref 0.0–0.7)
Eosinophils Relative: 2 % (ref 0–5)
Lymphs Abs: 2.7 10*3/uL (ref 0.7–4.0)
Monocytes Absolute: 1.1 10*3/uL — ABNORMAL HIGH (ref 0.1–1.0)
Monocytes Relative: 9 % (ref 3–12)
Neutrophils Relative %: 67 % (ref 43–77)

## 2010-04-26 LAB — BRAIN NATRIURETIC PEPTIDE: Pro B Natriuretic peptide (BNP): 45 pg/mL (ref 0.0–100.0)

## 2010-04-26 NOTE — Assessment & Plan Note (Addendum)
Summary: sleep apnea ///kp   Visit Type:  Initial Consult Copy to:  Tally Joe, [name] Primary Provider/Referring Provider:  Tally Joe, MD  CC:  Sleep Consult. pt c/o difficulty sleeping and staying asleep, snores, stops breathing at night, and feels tired during the day.  History of Present Illness: 60 yo male for sleep evaluation.  He had a heart attack in Dec 2011.  He was told his breathing would stop while asleep.  He does snore, and has witnessed apnea by his wife.  He will wake up gasping for air.  This has been present for years.  He has lost about 35 lbs since he was in the hospital.  This has helped his sleep, but he continues to have trouble with his breathing at night.  He goes to bed at 10pm.  He will take an [place] before bed, and has been using this for years.  He wakes up at 4am, and will lay in bed until 5am.  He will feel drowsy throughout the day, and falls asleep easily during the day when watching TV.  He feels like he gets better sleep when in a recliner.  He denies morning headache.  He used to grind his teeth, and had to get dental work for this.  He denies sleep talking, sleep walking, or nightmares.  There is no history of restless legs.  He denies sleep hallucinations, sleep paralysis, or cataplexy.  He gets sleepy at work.  He has noticed more trouble with his memory and feeling fatigued.  There is no history of thyroid disease.  He is on medication for depression, and this has helped.   Current Medications (verified): 1)  Aspirin 81 Mg Tabs (Aspirin) .... Take 1 Tablet By Mouth Once A Day 2)  Lisinopril 5 Mg Tabs (Lisinopril) .... Take 1 Tablet By Mouth Once A Day 3)  Metoprolol Tartrate 50 Mg Tabs (Metoprolol Tartrate) .... Take 1 and 1/2  Tablets By Mouth Two Times A Day 4)  Nitrostat 0.4 Mg Subl (Nitroglycerin) .... Take One Under Tongue As Needed For Chest Pain; May Repeat Every 5 Mins X 2 5)  Effient 10 Mg Tabs (Prasugrel Hcl) .... Take 1  Tablet By Mouth Once A Day 6)  Crestor 40 Mg Tabs (Rosuvastatin Calcium) .... Take 1 Tablet By Mouth Once A Day For Cholesterol 7)  Ambien 10 Mg Tabs (Zolpidem Tartrate) .... Take 1 Tab By Mouth At Bedtime As Needed 8)  Androgel Pump 1.25 Gm/act (1%) Gel (Testosterone) .... 3 or 4 X A Week 9)  Fish Oil 1000 Mg Caps (Omega-3 Fatty Acids) .Marland Kitchen.. 1 Cap Once Daily 10)  Multivitamins  Tabs (Multiple Vitamin) .... Once Daily 11)  Sertraline Hcl 25 Mg Tabs (Sertraline Hcl) .... Take 1 Tablet By Mouth Once A Day  Allergies (verified): No Known Drug Allergies  Past History:  Past Medical History: CAD   a.  s/p NSTEMI 12.5.2011: Tx with Promus DES to prox. LAD   b.  cath 12.5.2011: residulal - D1 80%; AVCFx 50%; pRCA 30-40%; dRCA 50%; EF 60%   c.  cath 03/16/10: pLAD stent ok; D1 80% (jailed); D2 occluded; CFX 30-40%; pRCA 40,         dRCA 40% - no change - med Rx Diabetes Type 2 Hyperlipidemia Hypertension Depression  Past Surgical History: Heart stent placed 01/18/2010 Hernia repair  Family History: Reviewed history from 03/14/2010 and no changes required. Father - CAD with CABG in 7's Mother - Breast and lung cancer  Social History: Reviewed history from 03/14/2010 and no changes required. 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.  Married.  Occasional alcohol.  No history of smoking.  Review of Systems       The patient complains of shortness of breath with activity, productive cough, non-productive cough, weight change, nasal congestion/difficulty breathing through nose, sneezing, itching, and hand/feet swelling.  The patient denies shortness of breath at rest, coughing up blood, chest pain, irregular heartbeats, acid heartburn, indigestion, loss of appetite, abdominal pain, difficulty swallowing, sore throat, tooth/dental problems, headaches, anxiety, depression, joint stiffness or pain, rash, change in color of mucus, and fever.    Vital Signs:  Patient profile:   59 year old male Height:      71  inches Weight:      251.25 pounds BMI:     35.17 O2 Sat:      96 % on Room air Temp:     97.8 degrees F oral Pulse rate:   61 / minute BP sitting:   130 / 78  (left arm) Cuff size:   large  Vitals Entered By: Carver Fila (April 15, 2010 10:03 AM)  O2 Flow:  Room air CC: Sleep Consult. pt c/o difficulty sleeping and staying asleep, snores, stops breathing at night, feels tired during the day Comments meds and allergies updated Phone number updated Carver Fila  April 15, 2010 10:03 AM    Physical Exam  General:  normal appearance, healthy appearing, and obese.   Eyes:  PERRLA/EOM intact; conjunctiva and sclera clear Nose:  no deformity, discharge, inflammation, or lesions Mouth:  MP 3, no exudate Neck:  no masses, thyromegaly, or abnormal cervical nodes Lungs:  clear bilaterally to auscultation and percussion Heart:  regular rate and rhythm, S1, S2 without murmurs, rubs, gallops, or clicks Abdomen:  obese, soft, nontender Pulses:  pulses normal Extremities:  no clubbing, cyanosis, edema, or deformity noted Neurologic:  normal CN II-XII and strength normal.   Cervical Nodes:  no significant adenopathy Psych:  alert and cooperative; normal mood and affect; normal attention span and concentration   Impression & Recommendations:  Problem # 1:  SLEEP APNEA (ICD-780.57) He has symptoms of snoring, witnessed apnea, and daytime sleepiness.  He has history of cardiovascular disease and diabetes.  I am concerned he could have sleep apnea.  To further assess this will arrange for sleep test.  Explained how sleep apnea can affect his health.  Driving precautions and need for weight loss discussed.  Problem # 2:  INSOMNIA (ICD-780.52) Explained that some of his "insomnia" symptoms may actually be related to sleep apnea.  Will further address ambien use after review of his sleep study.  Medications Added to Medication List This Visit: 1)  Multivitamins Tabs (Multiple vitamin) .... Once  daily  Complete Medication List: 1)  Aspirin 81 Mg Tabs (Aspirin) .... Take 1 tablet by mouth once a day 2)  Effient 10 Mg Tabs (Prasugrel hcl) .... Take 1 tablet by mouth once a day 3)  Lisinopril 5 Mg Tabs (Lisinopril) .... Take 1 tablet by mouth once a day 4)  Metoprolol Tartrate 50 Mg Tabs (Metoprolol tartrate) .... Take 1 and 1/2  tablets by mouth two times a day 5)  Crestor 40 Mg Tabs (Rosuvastatin calcium) .... Take 1 tablet by mouth once a day for cholesterol 6)  Fish Oil 1000 Mg Caps (Omega-3 fatty acids) .Marland Kitchen.. 1 cap once daily 7)  Sertraline Hcl 25 Mg Tabs (Sertraline hcl) .... Take 1 tablet by mouth  once a day 8)  Ambien 10 Mg Tabs (Zolpidem tartrate) .... Take 1 tab by mouth at bedtime as needed 9)  Androgel Pump 1.25 Gm/act (1%) Gel (Testosterone) .... 3 or 4 x a week 10)  Multivitamins Tabs (Multiple vitamin) .... Once daily 11)  Nitrostat 0.4 Mg Subl (Nitroglycerin) .... Take one under tongue as needed for chest pain; may repeat every 5 mins x 2  Other Orders: Consultation Level IV (16109) Sleep Study (Sleep Study)  Patient Instructions: 1)  Will schedule sleep test 2)  Will call to schedule follow up after sleep test reviewed   Immunization History:  Influenza Immunization History:    Influenza:  historical (10/14/2009)  Pneumovax Immunization History:    Pneumovax:  historical (10/14/2009)

## 2010-04-26 NOTE — Letter (Signed)
Summary: [facility]   Imported By: Kassie Mends 04/22/2010 10:24:10  _____________________________________________________________________  External Attachment:    Type:   Image     Comment:   External Document

## 2010-04-27 ENCOUNTER — Encounter (HOSPITAL_COMMUNITY): Payer: BC Managed Care – HMO

## 2010-04-29 ENCOUNTER — Encounter (HOSPITAL_COMMUNITY): Payer: BC Managed Care – HMO

## 2010-05-02 ENCOUNTER — Encounter (HOSPITAL_COMMUNITY): Payer: BC Managed Care – HMO

## 2010-05-04 ENCOUNTER — Encounter (HOSPITAL_COMMUNITY): Payer: BC Managed Care – HMO

## 2010-05-04 ENCOUNTER — Ambulatory Visit (HOSPITAL_BASED_OUTPATIENT_CLINIC_OR_DEPARTMENT_OTHER): Payer: BC Managed Care – HMO | Attending: Pulmonary Disease

## 2010-05-04 DIAGNOSIS — Z79899 Other long term (current) drug therapy: Secondary | ICD-10-CM | POA: Insufficient documentation

## 2010-05-04 DIAGNOSIS — G4733 Obstructive sleep apnea (adult) (pediatric): Secondary | ICD-10-CM | POA: Insufficient documentation

## 2010-05-06 ENCOUNTER — Encounter (HOSPITAL_COMMUNITY): Payer: BC Managed Care – HMO

## 2010-05-09 ENCOUNTER — Encounter (HOSPITAL_COMMUNITY): Payer: BC Managed Care – HMO

## 2010-05-11 ENCOUNTER — Encounter (HOSPITAL_COMMUNITY): Payer: BC Managed Care – HMO

## 2010-05-12 ENCOUNTER — Encounter: Payer: Self-pay | Admitting: Pulmonary Disease

## 2010-05-12 DIAGNOSIS — G4733 Obstructive sleep apnea (adult) (pediatric): Secondary | ICD-10-CM

## 2010-05-12 DIAGNOSIS — Z79899 Other long term (current) drug therapy: Secondary | ICD-10-CM

## 2010-05-12 NOTE — Assessment & Plan Note (Signed)
Reviewed PSG from 05/04/10.  Has mild to moderate sleep apnea with significant REM effect.    Will have my nurse call to schedule ROV to review results.

## 2010-05-13 ENCOUNTER — Encounter (HOSPITAL_COMMUNITY): Payer: BC Managed Care – HMO

## 2010-05-16 ENCOUNTER — Encounter (HOSPITAL_COMMUNITY): Payer: BC Managed Care – HMO | Attending: Cardiovascular Disease

## 2010-05-16 DIAGNOSIS — I10 Essential (primary) hypertension: Secondary | ICD-10-CM | POA: Insufficient documentation

## 2010-05-16 DIAGNOSIS — E119 Type 2 diabetes mellitus without complications: Secondary | ICD-10-CM | POA: Insufficient documentation

## 2010-05-16 DIAGNOSIS — I2510 Atherosclerotic heart disease of native coronary artery without angina pectoris: Secondary | ICD-10-CM | POA: Insufficient documentation

## 2010-05-16 DIAGNOSIS — E785 Hyperlipidemia, unspecified: Secondary | ICD-10-CM | POA: Insufficient documentation

## 2010-05-16 DIAGNOSIS — Z9861 Coronary angioplasty status: Secondary | ICD-10-CM | POA: Insufficient documentation

## 2010-05-16 DIAGNOSIS — Z7982 Long term (current) use of aspirin: Secondary | ICD-10-CM | POA: Insufficient documentation

## 2010-05-16 DIAGNOSIS — I214 Non-ST elevation (NSTEMI) myocardial infarction: Secondary | ICD-10-CM | POA: Insufficient documentation

## 2010-05-16 DIAGNOSIS — Z5189 Encounter for other specified aftercare: Secondary | ICD-10-CM | POA: Insufficient documentation

## 2010-05-17 ENCOUNTER — Encounter: Payer: Self-pay | Admitting: *Deleted

## 2010-05-18 ENCOUNTER — Encounter (HOSPITAL_COMMUNITY): Payer: BC Managed Care – HMO

## 2010-05-19 ENCOUNTER — Telehealth: Payer: Self-pay | Admitting: Pulmonary Disease

## 2010-05-19 NOTE — Telephone Encounter (Signed)
Called pt back and he states he just wanted to inform me he had to reschedule for a later date to 4/30 at 3:15 to discuss sleep results.

## 2010-05-20 ENCOUNTER — Encounter (HOSPITAL_COMMUNITY): Payer: BC Managed Care – HMO

## 2010-05-23 ENCOUNTER — Encounter (HOSPITAL_COMMUNITY): Payer: BC Managed Care – HMO

## 2010-05-25 ENCOUNTER — Encounter (HOSPITAL_COMMUNITY): Payer: BC Managed Care – HMO

## 2010-05-27 ENCOUNTER — Encounter (HOSPITAL_COMMUNITY): Payer: BC Managed Care – HMO

## 2010-05-30 ENCOUNTER — Encounter (HOSPITAL_COMMUNITY): Payer: BC Managed Care – HMO

## 2010-06-01 ENCOUNTER — Encounter (HOSPITAL_COMMUNITY): Payer: BC Managed Care – HMO

## 2010-06-03 ENCOUNTER — Encounter (HOSPITAL_COMMUNITY): Payer: BC Managed Care – HMO

## 2010-06-06 ENCOUNTER — Encounter (HOSPITAL_COMMUNITY): Payer: BC Managed Care – HMO

## 2010-06-08 ENCOUNTER — Ambulatory Visit: Payer: BC Managed Care – HMO | Admitting: Pulmonary Disease

## 2010-06-10 ENCOUNTER — Encounter: Payer: Self-pay | Admitting: Pulmonary Disease

## 2010-06-13 ENCOUNTER — Encounter: Payer: Self-pay | Admitting: Pulmonary Disease

## 2010-06-13 ENCOUNTER — Ambulatory Visit (INDEPENDENT_AMBULATORY_CARE_PROVIDER_SITE_OTHER): Payer: BC Managed Care – HMO | Admitting: Pulmonary Disease

## 2010-06-13 VITALS — BP 112/80 | HR 60 | Temp 97.4°F | Ht 70.0 in | Wt 257.0 lb

## 2010-06-13 DIAGNOSIS — G4700 Insomnia, unspecified: Secondary | ICD-10-CM

## 2010-06-13 DIAGNOSIS — R05 Cough: Secondary | ICD-10-CM | POA: Insufficient documentation

## 2010-06-13 DIAGNOSIS — R059 Cough, unspecified: Secondary | ICD-10-CM | POA: Insufficient documentation

## 2010-06-13 DIAGNOSIS — G4733 Obstructive sleep apnea (adult) (pediatric): Secondary | ICD-10-CM

## 2010-06-13 NOTE — Progress Notes (Signed)
Subjective:    Patient ID: [name], male    DOB: August 11, 1950, 60 y.o.   MRN: [id number]  HPI 60 yo male with sleep apnea.  He is hear to follow up his sleep study.  This was done on 05/04/10 and showed an overall AHI of 5, but REM AHI of 19.  He continues to have sleep problems as before.  He continues to use [place] on a nightly basis.  He has also been having a cough with clear sputum which has been present for years.  Past Medical History  Diagnosis Date  . Obstructive sleep apnea (adult) (pediatric)     PSG 05/04/10>>AHI 5, REM 19  . Depression   . Hypertension   . Hyperlipemia   . Diabetes mellitus, type 2   . CAD (coronary artery disease)     a. s/p NSTENI 12.5.11: [place]  w/ promus DES to prox. LAD  B. cath 12.5.11: residulal- D1 80%; AVCFx 50%; pRCA 50%; EF 60%  C) cath 03/16/10; pLAD stent ok; d1 80% (jailed); D2 occluded; CFX 30-40%; pRCA 40, dRCA 40%- no change-med rx     Family History  Problem Relation Age of Onset  . Coronary artery disease Father     CABG in 7's  . Breast cancer Mother   . Lung cancer Mother      History   Social History  . Marital Status: Married    Spouse Name: N/A    Number of Children: N/A  . Years of Education: N/A   Occupational History  . DIVISION PRESIDENT   . 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 Smoking status: Never Smoker   . Smokeless tobacco: Not on file  . Alcohol Use: Yes     occasionally  . Drug Use: Not on file  . Sexually Active: Not on file   Other Topics Concern  . Not on file   Social History Narrative  . No narrative on file     No Known Allergies   Outpatient Prescriptions Prior to Visit  Medication Sig Dispense Refill  . aspirin 81 MG tablet Take 81 mg by mouth daily.        Marland Kitchen lisinopril (PRINIVIL,ZESTRIL) 5 MG tablet Take 5 mg by mouth daily.        . metoprolol (LOPRESSOR) 50 MG tablet 1 and 1/2 tablets twice a day       . Multiple Vitamin (MULTIVITAMIN) capsule Take 1  capsule by mouth daily.        . nitroGLYCERIN (NITROSTAT) 0.4 MG SL tablet Take 1 under tongue as needed for chest pain; may repeat every 5 minutes x 2       . Omega-3 Fatty Acids (FISH OIL) 1000 MG CAPS Take by mouth. Once a day       . prasugrel (EFFIENT) 10 MG TABS Take by mouth. Once a day       . rosuvastatin (CRESTOR) 40 MG tablet Take 40 mg by mouth daily.        . sertraline (ZOLOFT) 25 MG tablet Take 25 mg by mouth daily.        . Testosterone (ANDROGEL PUMP) 1.25 GM/ACT (1%) GEL Place onto the skin. 3 or 4 x a week       . zolpidem (AMBIEN) 10 MG tablet Take 10 mg by mouth at bedtime as needed.            Review of Systems     Objective:  Physical Exam Filed Vitals:   06/13/10 1512  BP: 112/80  Pulse: 60  Temp: 97.4 F (36.3 C)  TempSrc: Oral  Height: 5\' 10"  (1.778 m)  Weight: 257 lb (116.574 kg)  SpO2: 94%       General: normal appearance, healthy appearing, and obese.  Eyes: PERRLA/EOM intact; conjunctiva and sclera clear  Nose: no deformity, discharge, inflammation, or lesions  Mouth: MP 3, no exudate  Neck: no masses, thyromegaly, or abnormal cervical nodes  Lungs: clear bilaterally to auscultation and percussion  Heart: regular rate and rhythm, S1, S2 without murmurs, rubs, gallops, or clicks  Abdomen: obese, soft, nontender  Pulses: pulses normal  Extremities: no clubbing, cyanosis, edema, or deformity noted  Neurologic: normal CN II-XII and strength normal.  Cervical Nodes: no significant adenopathy  Psych: alert and cooperative; normal mood and affect; normal attention span and concentration    Assessment & Plan:   OSA (obstructive sleep apnea) He has mild to moderate sleep apnea.  I have reviewed his sleep test results with the patient.  Explained how sleep apnea can affect the patient's health.  Driving precautions and importance of weight loss were discussed.  Treatment options for sleep apnea were reviewed.  Will arrange for auto-cpap at  home.  If this is unsuccessful he will need an in-lab titration study.   INSOMNIA Continue zolpidem for now.  Cough He reports have a cough for years.  He has sinus congestion with post-nasal drip.  There is no history of smoking.  He denies symptoms of reflux.  Chest xray from December 2011 was unremarkable.    I have advised that he could try nasal irrigation for his sinuses and OTC prilosec for possible reflux disease.  He is to discuss further with primary care, and then determine if further pulmonary intervention is needed.    Updated Medication List Outpatient Encounter Prescriptions as of 06/13/2010  Medication Sig Dispense Refill  . aspirin 81 MG tablet Take 81 mg by mouth daily.        Marland Kitchen lisinopril (PRINIVIL,ZESTRIL) 5 MG tablet Take 5 mg by mouth daily.        . metoprolol (LOPRESSOR) 50 MG tablet 1 and 1/2 tablets twice a day       . Multiple Vitamin (MULTIVITAMIN) capsule Take 1 capsule by mouth daily.        . nitroGLYCERIN (NITROSTAT) 0.4 MG SL tablet Take 1 under tongue as needed for chest pain; may repeat every 5 minutes x 2       . Omega-3 Fatty Acids (FISH OIL) 1000 MG CAPS Take by mouth. Once a day       . prasugrel (EFFIENT) 10 MG TABS Take by mouth. Once a day       . rosuvastatin (CRESTOR) 40 MG tablet Take 40 mg by mouth daily.        . sertraline (ZOLOFT) 25 MG tablet Take 25 mg by mouth daily.        . Testosterone (ANDROGEL PUMP) 1.25 GM/ACT (1%) GEL Place onto the skin. 3 or 4 x a week       . zolpidem (AMBIEN) 10 MG tablet Take 10 mg by mouth at bedtime as needed.

## 2010-06-13 NOTE — Procedures (Signed)
NAME:  [name], FONTENOT NO.:  [id number]  MEDICAL RECORD NO.:  [id number]         PATIENT TYPE:  OUT  LOCATION:  SLEEP CENTER                 FACILITY:  [facility]  PHYSICIAN:  Coralyn Helling, MD        DATE OF BIRTH:  1950-08-01  DATE OF STUDY:  05/04/2010                           NOCTURNAL POLYSOMNOGRAM  REFERRING PHYSICIAN:  Coralyn Helling, MD  INDICATIONS:  [name] is a 60 year old male who has a history of coronary disease, hypertension and diabetes.  He also has snoring, witnessed apnea and daytime sleepiness.  He is therefore referred to the sleep lab for evaluation of hypersomnia with obstructive sleep apnea.  Height is 71 inches, weight is 245 pounds.  BMI is 34.  Neck size is 17.5 inches.  MEDICATIONS:  Aspirin, Ambien, metoprolol, lisinopril, sertraline, FEN, Crestor, AndroGel and Nitrostat.  The patient took an Ambien on the night of study.  EPWORTH SCORE:  16..  SLEEP ARCHITECTURE:  Total recording time was 415 minutes.  Total sleep time was 357 minutes.  Sleep efficiency was 85%.  Sleep latency was 34 minutes.  REM latency was 99 minutes.  The study was notable for the lack of stage III sleep and the patient slept predominately in the nonsupine position.  RESPIRATORY DATA:  The average respiratory rate was 18.  Loud snoring was noted by the technician.  The overall apnea copy index was 5.  There were 2 central apneic events.  The remainder of the events were obstructive in nature.  The REM apnea-hypopnea index was 19.  The non- REM apnea-hypopnea index was 3.2.  The supine apnea-hypopnea index was 5.  The non-supine apnea-hypopnea index was 4.2.  OXYGEN DATA:  The baseline oxygenation was 95%.  The oxygen saturation nadir was 85%.  The patient spent a total of 0.9 minutes with an oxygen saturation below 88%.  CARDIAC DATA:  The average heart rate was 59 and the rhythm showed sinus rhythm with occasional PACs.  MOVEMENT PARASOMNIA:  The periodic  limb movement index was 0 and the patient had no restroom trips.  IMPRESSION:  This study shows evidence for mild-to-moderate obstructive sleep apnea.  He had a predominance of his respiratory events during REM sleep.  I would recommend that the patient undergo counseling regarding the importance of weight reduction.  However, given his history of cardiovascular disease and diabetes, additional therapeutic options could include CPAP therapy, oral appliance or surgical intervention.     Coralyn Helling, MD Diplomat, American Board of Sleep Medicine Electronically Signed    VS/MEDQ  D:  05/12/2010 08:04:40  T:  05/12/2010 04:54:09  Job:  [id number]

## 2010-06-13 NOTE — Assessment & Plan Note (Signed)
He has mild to moderate sleep apnea.  I have reviewed his sleep test results with the patient.  Explained how sleep apnea can affect the patient's health.  Driving precautions and importance of weight loss were discussed.  Treatment options for sleep apnea were reviewed.  Will arrange for auto-cpap at home.  If this is unsuccessful he will need an in-lab titration study.

## 2010-06-13 NOTE — Assessment & Plan Note (Signed)
He reports have a cough for years.  He has sinus congestion with post-nasal drip.  There is no history of smoking.  He denies symptoms of reflux.  Chest xray from December 2011 was unremarkable.    I have advised that he could try nasal irrigation for his sinuses and OTC prilosec for possible reflux disease.  He is to discuss further with primary care, and then determine if further pulmonary intervention is needed.

## 2010-06-13 NOTE — Assessment & Plan Note (Signed)
Continue zolpidem for now.

## 2010-06-13 NOTE — Patient Instructions (Signed)
Will set up CPAP machine at home Follow up in 6 to 8 weeks Talk to Dr. Azucena Cecil about your cough

## 2010-06-27 ENCOUNTER — Encounter: Payer: Self-pay | Admitting: Pulmonary Disease

## 2010-06-29 ENCOUNTER — Encounter: Payer: Self-pay | Admitting: Cardiovascular Disease

## 2010-06-29 ENCOUNTER — Ambulatory Visit (INDEPENDENT_AMBULATORY_CARE_PROVIDER_SITE_OTHER): Payer: BC Managed Care – HMO | Admitting: Cardiovascular Disease

## 2010-06-29 DIAGNOSIS — E7800 Pure hypercholesterolemia, unspecified: Secondary | ICD-10-CM

## 2010-06-29 DIAGNOSIS — I2510 Atherosclerotic heart disease of native coronary artery without angina pectoris: Secondary | ICD-10-CM

## 2010-06-29 DIAGNOSIS — I10 Essential (primary) hypertension: Secondary | ICD-10-CM

## 2010-06-29 DIAGNOSIS — E785 Hyperlipidemia, unspecified: Secondary | ICD-10-CM

## 2010-06-29 MED ORDER — ROSUVASTATIN CALCIUM 20 MG PO TABS: 20.0000 mg | ORAL_TABLET | Freq: Every day | ORAL | Status: DC

## 2010-06-29 NOTE — Assessment & Plan Note (Signed)
Blood pressure is currently well controlled. He had a period of a few weeks where he noted significantly elevated blood pressures but since then he has reduced salt intake in his diet and this seems to have gotten him back down to goal.

## 2010-06-29 NOTE — Assessment & Plan Note (Signed)
The patient is stable without angina. He is on appropriate medical therapy including dual antiplatelet therapy drugs.  He is on appropriate risk reduction medication with an ACE inhibitor, beta blocker, and statin drug. Encouraged continued exercise with increased efforts at weight loss. I would like to see him back in followup in 6 months.

## 2010-06-29 NOTE — Patient Instructions (Signed)
Your physician has recommended you make the following change in your medication: DECREASE Crestor to 20mg  once a day  Your physician recommends that you return for a FASTING lipid profile and liver profile in 3 MONTHS (414.01, 272.0)--nothing to eat or drink after midnight, lab opens at 8:30  Your physician wants you to follow-up in: 6 MONTHS.  You will receive a reminder letter in the mail two months in advance. If you don't receive a letter, please call our office to schedule the follow-up appointment.  Please call the Primary Care office at [facility] [phone] to establish with a new physician.

## 2010-06-29 NOTE — Progress Notes (Signed)
HPI:  This is a 60 year old gentleman with coronary artery disease, type 2 diabetes, hypertension, and hyperlipidemia, presenting for followup evaluation. He presented in December 2001 with acute coronary syndrome and underwent treatment of a critical proximal LAD lesion with a Promus drug-eluting stent. The patient underwent followup catheterization in February when he presented with chest pain. This demonstrated wide patency of his LAD stent and otherwise stable CAD. He has occlusion of a small diagonal branch and stenosis of another diagonal branch arising from the stented segment of LAD. Medical therapy was recommended.  He is doing well at present. He denies exertional chest pain or pressure. He has some fleeting chest pains following exercise but these only last a few seconds. He denies exertional dyspnea or edema. He denies lightheadedness or syncope. He does complain of significant memory problems and relates this to his statin drug. He has been exercising regularly.  Outpatient Encounter Prescriptions as of 06/29/2010  Medication Sig Dispense Refill  . aspirin 81 MG tablet Take 81 mg by mouth daily.        Marland Kitchen lisinopril (PRINIVIL,ZESTRIL) 5 MG tablet Take 5 mg by mouth daily.        . metoprolol (LOPRESSOR) 50 MG tablet 1 and 1/2 tablets twice a day       . Multiple Vitamin (MULTIVITAMIN) capsule Take 1 capsule by mouth daily.        . nitroGLYCERIN (NITROSTAT) 0.4 MG SL tablet Take 1 under tongue as needed for chest pain; may repeat every 5 minutes x 2       . NON FORMULARY cpap nightly       . Omega-3 Fatty Acids (FISH OIL) 1000 MG CAPS Take by mouth. Once a day       . prasugrel (EFFIENT) 10 MG TABS Take by mouth. Once a day       . rosuvastatin (CRESTOR) 20 MG tablet Take 1 tablet (20 mg total) by mouth daily.  30 tablet  11  . sertraline (ZOLOFT) 25 MG tablet Take 25 mg by mouth daily.        . Testosterone (ANDROGEL PUMP) 1.25 GM/ACT (1%) GEL Place 3 Squirts onto the skin daily.       Marland Kitchen  zolpidem (AMBIEN) 10 MG tablet Take 10 mg by mouth at bedtime as needed.        Marland Kitchen DISCONTD: rosuvastatin (CRESTOR) 40 MG tablet Take 40 mg by mouth daily.          No Known Allergies  Past Medical History  Diagnosis Date  . Obstructive sleep apnea (adult) (pediatric)     PSG 05/04/10>>AHI 5, REM 19  . Depression   . Hypertension   . Hyperlipemia   . Diabetes mellitus, type 2   . CAD (coronary artery disease)     a. s/p NSTENI 12.5.11: [place]  w/ promus DES to prox. LAD  B. cath 12.5.11: residulal- D1 80%; AVCFx 50%; pRCA 50%; EF 60%  C) cath 03/16/10; pLAD stent ok; d1 80% (jailed); D2 occluded; CFX 30-40%; pRCA 40, dRCA 40%- no change-med rx    ROS: Negative except as per HPI  BP 132/84  Pulse 61  Ht 5' 10.5" (1.791 m)  Wt 255 lb (115.667 kg)  BMI 36.07 kg/m2  PHYSICAL EXAM: Pt is alert and oriented, obese male in NAD HEENT: normal Neck: JVP - normal, carotids 2+= without bruits Lungs: CTA bilaterally CV: RRR without murmur or gallop Abd: soft, NT, Positive BS, no hepatomegaly Ext: no C/C/E, distal pulses  intact and equal Skin: warm/dry no rash  ASSESSMENT AND PLAN:

## 2010-06-29 NOTE — Assessment & Plan Note (Signed)
He notes memory impairment and is very well may be related to his statin drug. He would like to continue on lipid lowering therapy because he realizes the cardiovascular benefits. We'll reduce rosuvastatin to 20 mg daily and see if this has an impact. Recommend followup lipids and LFTs in 12 weeks.

## 2010-07-21 ENCOUNTER — Encounter: Payer: Self-pay | Admitting: *Deleted

## 2010-08-01 ENCOUNTER — Encounter: Payer: Self-pay | Admitting: Pulmonary Disease

## 2010-08-10 ENCOUNTER — Encounter: Payer: Self-pay | Admitting: Pulmonary Disease

## 2010-08-10 ENCOUNTER — Ambulatory Visit (INDEPENDENT_AMBULATORY_CARE_PROVIDER_SITE_OTHER): Payer: BC Managed Care – HMO | Admitting: Pulmonary Disease

## 2010-08-10 VITALS — BP 138/90 | HR 60 | Temp 98.1°F | Ht 70.0 in | Wt 260.8 lb

## 2010-08-10 DIAGNOSIS — G4733 Obstructive sleep apnea (adult) (pediatric): Secondary | ICD-10-CM

## 2010-08-10 NOTE — Patient Instructions (Signed)
Follow up in 6 months 

## 2010-08-10 NOTE — Progress Notes (Signed)
Subjective:    Patient ID: [name], male    DOB: 12/27/1950, 60 y.o.   MRN: [id number]  HPI CC: [name]  60 yo male with sleep apnea.  He is doing well with CPAP.  He is sleeping better, and feels better during the day.  He has nasal pillows, and no problem with the mask.  He sometimes only sleeps for 4 or 5 hours per night, and then will feel sleep during the day.  Past Medical History  Diagnosis Date  . Obstructive sleep apnea (adult) (pediatric)     PSG 05/04/10>>AHI 5, REM 19  . Depression   . Hypertension   . Hyperlipemia   . Diabetes mellitus, type 2   . CAD (coronary artery disease)     a. s/p NSTEMI 12.5.11: [place]  w/ promus DES to prox. LAD  B. cath 12.5.11: residulal- D1 80%; AVCFx 50%; pRCA 50%; EF 60%  C) cath 03/16/10; pLAD stent ok; d1 80% (jailed); D2 occluded; CFX 30-40%; pRCA 40, dRCA 40%- no change-med rx     Family History  Problem Relation Age of Onset  . Coronary artery disease Father     CABG in 48's  . Breast cancer Mother   . Lung cancer Mother      History   Social History  . Marital Status: Married    Spouse Name: N/A    Number of Children: N/A  . Years of Education: N/A   Occupational History  . DIVISION PRESIDENT   . 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 Smoking status: Never Smoker   . Smokeless tobacco: Never Used  . Alcohol Use: Yes     occasionally  . Drug Use: Not on file  . Sexually Active: Not on file   Other Topics Concern  . Not on file   Social History Narrative  . No narrative on file     No Known Allergies   Outpatient Prescriptions Prior to Visit  Medication Sig Dispense Refill  . aspirin 81 MG tablet Take 81 mg by mouth daily.        Marland Kitchen lisinopril (PRINIVIL,ZESTRIL) 5 MG tablet Take 5 mg by mouth daily.        . metoprolol (LOPRESSOR) 50 MG tablet 1 and 1/2 tablets twice a day       . Multiple Vitamin (MULTIVITAMIN) capsule Take 1 capsule by mouth daily.        . nitroGLYCERIN  (NITROSTAT) 0.4 MG SL tablet Take 1 under tongue as needed for chest pain; may repeat every 5 minutes x 2       . NON FORMULARY cpap nightly       . Omega-3 Fatty Acids (FISH OIL) 1000 MG CAPS Take by mouth. Once a day       . prasugrel (EFFIENT) 10 MG TABS Take by mouth. Once a day       . rosuvastatin (CRESTOR) 20 MG tablet Take 1 tablet (20 mg total) by mouth daily.  30 tablet  11  . sertraline (ZOLOFT) 25 MG tablet Take 25 mg by mouth daily.        . Testosterone (ANDROGEL PUMP) 1.25 GM/ACT (1%) GEL Place 3 Squirts onto the skin. On each shoulder daily      . zolpidem (AMBIEN) 10 MG tablet Take 10 mg by mouth at bedtime.        Review of Systems     Objective:   Physical Exam  BP  138/90  Pulse 60  Temp(Src) 98.1 F (36.7 C) (Oral)  Ht 5\' 10"  (1.778 m)  Wt 260 lb 12.8 oz (118.298 kg)  BMI 37.42 kg/m2  SpO2 95%  General: normal appearance, healthy appearing, and obese.  Eyes: PERRLA/EOM intact; conjunctiva and sclera clear  Nose: no deformity, discharge, inflammation, or lesions  Mouth: MP 3, no exudate  Neck: no masses, thyromegaly, or abnormal cervical nodes  Lungs: clear bilaterally to auscultation and percussion  Heart: regular rate and rhythm, S1, S2 without murmurs, rubs, gallops, or clicks  Abdomen: obese, soft, nontender  Pulses: pulses normal  Extremities: no clubbing, cyanosis, edema, or deformity noted  Neurologic: normal CN II-XII and strength normal.  Cervical Nodes: no significant adenopathy  Psych: alert and cooperative; normal mood and affect; normal attention span and concentration      Assessment & Plan:   OSA (obstructive sleep apnea) He is doing well with CPAP.  He would like to continue with auto-CPAP.  Advised him to use his CPAP whenever he is asleep, including during naps.  Also advised him to allow more time for sleep at night.    Updated Medication List Outpatient Encounter Prescriptions as of 08/10/2010  Medication Sig Dispense Refill  .  aspirin 81 MG tablet Take 81 mg by mouth daily.        Marland Kitchen lisinopril (PRINIVIL,ZESTRIL) 5 MG tablet Take 5 mg by mouth daily.        . metoprolol (LOPRESSOR) 50 MG tablet 1 and 1/2 tablets twice a day       . Multiple Vitamin (MULTIVITAMIN) capsule Take 1 capsule by mouth daily.        . nitroGLYCERIN (NITROSTAT) 0.4 MG SL tablet Take 1 under tongue as needed for chest pain; may repeat every 5 minutes x 2       . NON FORMULARY cpap nightly       . Omega-3 Fatty Acids (FISH OIL) 1000 MG CAPS Take by mouth. Once a day       . prasugrel (EFFIENT) 10 MG TABS Take by mouth. Once a day       . rosuvastatin (CRESTOR) 20 MG tablet Take 1 tablet (20 mg total) by mouth daily.  30 tablet  11  . sertraline (ZOLOFT) 25 MG tablet Take 25 mg by mouth daily.        . Testosterone (ANDROGEL PUMP) 1.25 GM/ACT (1%) GEL Place 3 Squirts onto the skin. On each shoulder daily      . zolpidem (AMBIEN) 10 MG tablet Take 10 mg by mouth at bedtime.

## 2010-08-10 NOTE — Assessment & Plan Note (Signed)
He is doing well with CPAP.  He would like to continue with auto-CPAP.  Advised him to use his CPAP whenever he is asleep, including during naps.  Also advised him to allow more time for sleep at night.

## 2010-08-11 ENCOUNTER — Other Ambulatory Visit: Payer: Self-pay | Admitting: Cardiovascular Disease

## 2010-08-11 NOTE — Telephone Encounter (Signed)
Pt's wife calling re refill request, pt going out of town Saturday and needs refill asap please

## 2010-08-27 ENCOUNTER — Other Ambulatory Visit: Payer: Self-pay | Admitting: Physician Assistant

## 2010-09-14 ENCOUNTER — Encounter (INDEPENDENT_AMBULATORY_CARE_PROVIDER_SITE_OTHER): Payer: BC Managed Care – HMO

## 2010-09-14 ENCOUNTER — Other Ambulatory Visit (INDEPENDENT_AMBULATORY_CARE_PROVIDER_SITE_OTHER): Payer: BC Managed Care – HMO | Admitting: *Deleted

## 2010-09-14 DIAGNOSIS — E7800 Pure hypercholesterolemia, unspecified: Secondary | ICD-10-CM

## 2010-09-14 DIAGNOSIS — R0989 Other specified symptoms and signs involving the circulatory and respiratory systems: Secondary | ICD-10-CM

## 2010-09-14 DIAGNOSIS — I2510 Atherosclerotic heart disease of native coronary artery without angina pectoris: Secondary | ICD-10-CM

## 2010-09-15 LAB — HEPATIC FUNCTION PANEL
ALT: 35 U/L (ref 0–53)
AST: 24 U/L (ref 0–37)
Alkaline Phosphatase: 64 U/L (ref 39–117)
Bilirubin, Direct: 0.2 mg/dL (ref 0.0–0.3)
Total Bilirubin: 0.6 mg/dL (ref 0.3–1.2)

## 2010-09-16 ENCOUNTER — Encounter: Payer: Self-pay | Admitting: Pulmonary Disease

## 2010-09-20 ENCOUNTER — Encounter: Payer: Self-pay | Admitting: Pulmonary Disease

## 2010-10-18 ENCOUNTER — Telehealth: Payer: Self-pay | Admitting: Cardiovascular Disease

## 2010-10-18 DIAGNOSIS — I2510 Atherosclerotic heart disease of native coronary artery without angina pectoris: Secondary | ICD-10-CM

## 2010-10-18 MED ORDER — SERTRALINE HCL 25 MG PO TABS: 25.0000 mg | ORAL_TABLET | Freq: Every day | ORAL | Status: DC

## 2010-10-18 NOTE — Telephone Encounter (Signed)
Per Dr. Excell Seltzer OK to refill.  Will send to [facility] on [address].  Called pt to give him this information.  Number listed in documentation below is not correct.  Message left on pt's home number to call back.

## 2010-10-18 NOTE — Telephone Encounter (Signed)
Pt rtn call said missed call [phone]

## 2010-10-18 NOTE — Telephone Encounter (Signed)
Pt needs refill generic zoloft, uses [facility]

## 2010-12-06 ENCOUNTER — Telehealth: Payer: Self-pay | Admitting: Cardiovascular Disease

## 2010-12-06 NOTE — Telephone Encounter (Signed)
Pt wants to know what cold medicine he can take please call

## 2010-12-06 NOTE — Telephone Encounter (Signed)
Pt called and is complaining of cough and cold symptoms.  I told him to avoid decongestants.  Went over cough meds and antihistamines that do not contain decongestants.

## 2010-12-06 NOTE — Telephone Encounter (Signed)
N/A.  LMTC. 

## 2010-12-06 NOTE — Telephone Encounter (Signed)
Pt returning your call-please call back

## 2011-01-02 ENCOUNTER — Encounter: Payer: Self-pay | Admitting: Cardiovascular Disease

## 2011-01-02 ENCOUNTER — Ambulatory Visit (INDEPENDENT_AMBULATORY_CARE_PROVIDER_SITE_OTHER): Payer: BC Managed Care – HMO | Admitting: Cardiovascular Disease

## 2011-01-02 VITALS — BP 158/74 | HR 60 | Ht 71.0 in | Wt 262.0 lb

## 2011-01-02 DIAGNOSIS — I10 Essential (primary) hypertension: Secondary | ICD-10-CM

## 2011-01-02 DIAGNOSIS — E785 Hyperlipidemia, unspecified: Secondary | ICD-10-CM

## 2011-01-02 DIAGNOSIS — I2510 Atherosclerotic heart disease of native coronary artery without angina pectoris: Secondary | ICD-10-CM

## 2011-01-02 NOTE — Assessment & Plan Note (Signed)
LDL is at goal on Crestor. He will be due for repeat lipids in August 2013.

## 2011-01-02 NOTE — Assessment & Plan Note (Signed)
Blood pressure is elevated. This is probably related to weight gain. He is going to refocus his diet. He will record home blood pressures and call back with readings in a few weeks. We could increase his lisinopril if his home blood pressures are elevated.

## 2011-01-02 NOTE — Patient Instructions (Signed)
Your physician has requested that you regularly monitor and record your blood pressure readings at home. Please use the same machine at the same time of day to check your readings and record them to bring to your follow-up visit.  Please also call our office in a few weeks with your BP readings.  Please call if they are consistently above 140/90.   Your physician wants you to follow-up in: August 2013. You will receive a reminder letter in the mail two months in advance. If you don't receive a letter, please call our office to schedule the follow-up appointment.  Your physician recommends that you return for a FASTING lipid and liver profile in August 2013 (414.01)  Your physician recommends that you continue on your current medications as directed. Please refer to the Current Medication list given to you today.

## 2011-01-02 NOTE — Assessment & Plan Note (Signed)
The patient is stable without angina. We will continue his current medical program. We discussed the risk/benefit considerations for ongoing dual antiplatelet therapy with aspirin and effient. We decided to continue with those medications for another 12 months. He will otherwise continue his current medical program.

## 2011-01-02 NOTE — Progress Notes (Signed)
HPI:  Mr. [name] presents for followup. He is a 60 year old gentleman with coronary artery disease. He presented with non-ST elevation infarction in 2011 and was treated with a drug-eluting stent in the proximal LAD. He had a heavy thrombus burden at the time of his procedure. He underwent repeat catheterization earlier this year showing patency of his stent site. Overall he is doing well. He denies recurrent chest pain or pressure. He denies dyspnea, edema, or palpitations. He has not been as strict with his diet and notes that he has gained weight since his last office visit. He does continue with regular exercise and has no exertional symptoms. His Crestor was reduced several months ago because of memory impairment and he notes that this has stabilized. He's had no progressive cognitive problems.  Outpatient Encounter Prescriptions as of 01/02/2011  Medication Sig Dispense Refill  . aspirin 81 MG tablet Take 81 mg by mouth daily.        Marland Kitchen EFFIENT 10 MG TABS TAKE ONE TABLET BY MOUTH EVERY DAY  30 each  5  . lisinopril (PRINIVIL,ZESTRIL) 5 MG tablet TAKE ONE TABLET BY MOUTH EVERY DAY.  30 tablet  6  . metoprolol (LOPRESSOR) 50 MG tablet TAKE ONE & ONE-HALF TABLETS BY MOUTH TWICE A DAY.  90 tablet  11  . Multiple Vitamin (MULTIVITAMIN) capsule Take 1 capsule by mouth daily.        . nitroGLYCERIN (NITROSTAT) 0.4 MG SL tablet Take 1 under tongue as needed for chest pain; may repeat every 5 minutes x 2       . NON FORMULARY cpap nightly       . Omega-3 Fatty Acids (FISH OIL) 1000 MG CAPS Take by mouth. Once a day       . rosuvastatin (CRESTOR) 20 MG tablet Take 1 tablet (20 mg total) by mouth daily.  30 tablet  11  . sertraline (ZOLOFT) 25 MG tablet Take 1 tablet (25 mg total) by mouth daily.  30 tablet  11  . Testosterone (ANDROGEL PUMP) 1.25 GM/ACT (1%) GEL Place 3 Squirts onto the skin. On each shoulder daily      . zolpidem (AMBIEN CR) 12.5 MG CR tablet Take 1 tablet by mouth At bedtime as needed.       Marland Kitchen DISCONTD: zolpidem (AMBIEN) 10 MG tablet Take 10 mg by mouth at bedtime.         No Known Allergies  Past Medical History  Diagnosis Date  . Obstructive sleep apnea (adult) (pediatric)     PSG 05/04/10>>AHI 5, REM 19  . Depression   . Hypertension   . Hyperlipemia   . Diabetes mellitus, type 2   . CAD (coronary artery disease)     a. s/p NSTEMI 12.5.11: [place]  w/ promus DES to prox. LAD  B. cath 12.5.11: residulal- D1 80%; AVCFx 50%; pRCA 50%; EF 60%  C) cath 03/16/10; pLAD stent ok; d1 80% (jailed); D2 occluded; CFX 30-40%; pRCA 40, dRCA 40%- no change-med rx    ROS: Negative except as per HPI  BP 158/74  Pulse 60  Ht 5\' 11"  (1.803 m)  Wt 118.842 kg (262 lb)  BMI 36.54 kg/m2  PHYSICAL EXAM: Pt is alert and oriented, obese male in NAD HEENT: normal Neck: JVP - normal, carotids 2+= without bruits Lungs: CTA bilaterally CV: RRR without murmur or gallop Abd: soft, NT, Positive BS, no hepatomegaly Ext: no C/C/E, distal pulses intact and equal Skin: warm/dry no rash  EKG:  Normal sinus  rhythm 60 beats per minute, within normal limits.  ASSESSMENT AND PLAN:

## 2011-02-02 ENCOUNTER — Ambulatory Visit: Payer: BC Managed Care – HMO | Admitting: Pulmonary Disease

## 2011-02-03 ENCOUNTER — Other Ambulatory Visit: Payer: Self-pay

## 2011-02-03 ENCOUNTER — Other Ambulatory Visit: Payer: Self-pay | Admitting: Cardiovascular Disease

## 2011-02-03 MED ORDER — PRASUGREL HCL 10 MG PO TABS: 10.0000 mg | ORAL_TABLET | Freq: Every day | ORAL | Status: DC

## 2011-03-01 ENCOUNTER — Encounter: Payer: Self-pay | Admitting: Pulmonary Disease

## 2011-03-01 ENCOUNTER — Ambulatory Visit (INDEPENDENT_AMBULATORY_CARE_PROVIDER_SITE_OTHER): Payer: BC Managed Care – HMO | Admitting: Pulmonary Disease

## 2011-03-01 VITALS — BP 122/72 | HR 54 | Temp 97.7°F | Ht 70.5 in | Wt 267.2 lb

## 2011-03-01 DIAGNOSIS — G4733 Obstructive sleep apnea (adult) (pediatric): Secondary | ICD-10-CM

## 2011-03-01 NOTE — Assessment & Plan Note (Signed)
He has done well with CPAP.  He reports compliance and benefit from CPAP therapy.  Advised him to discuss with his DME about adjusting his mask tubing.  He is to call if he still has trouble.

## 2011-03-01 NOTE — Progress Notes (Signed)
Chief Complaint  Patient presents with  . Follow-up    Pt states he wears cpap everynight x 5-6 hrs a night. Pt states he rolls over and pulls the machine off the night stand and the hose fills up with water 3-4 times a week. pt states he feels rested but still has to take a nap during the day    History of Present Illness: [name] is a 61 y.o. male with OSA using Auto CPAP.  He feels CPAP has helped his sleep.  His wife does not hear him snore with the mask.  He has a nasal mask.  He has trouble with the mask/machine getting shifted at night.  This happens a few times per week.  He then has water build up in his mask.  He also sleeps for a few hours in the morning in his recliner.  He has trouble sleeping through the night.  He does not use his CPAP during the day when he naps.  Past Medical History  Diagnosis Date  . Obstructive sleep apnea (adult) (pediatric)     PSG 05/04/10>>AHI 5, REM 19  . Depression   . Hypertension   . Hyperlipemia   . Diabetes mellitus, type 2   . CAD (coronary artery disease)     a. s/p NSTEMI 12.5.11: [place]  w/ promus DES to prox. LAD  B. cath 12.5.11: residulal- D1 80%; AVCFx 50%; pRCA 50%; EF 60%  C) cath 03/16/10; pLAD stent ok; d1 80% (jailed); D2 occluded; CFX 30-40%; pRCA 40, dRCA 40%- no change-med rx    Past Surgical History  Procedure Date  . Heart stent placed 01/18/2010  . Hernia repair     No Known Allergies  Physical Exam:  Blood pressure 122/72, pulse 54, temperature 97.7 F (36.5 C), temperature source Oral, height 5' 10.5" (1.791 m), weight 267 lb 3.2 oz (121.201 kg), SpO2 97.00%. Body mass index is 37.80 kg/(m^2). Wt Readings from Last 2 Encounters:  03/01/11 267 lb 3.2 oz (121.201 kg)  01/02/11 262 lb (118.842 kg)    General - Obese HEENT - MP 3, no sinus tenderness, no LAN Cardiac - s1s2 regular Chest - no wheeze/rales Abdomen - soft, nontender Extremities - no edema Skin - no rashes Neurologic - normal  strength Psychiatric - normal mood, behavior   Assessment/Plan:  Outpatient Encounter Prescriptions as of 03/01/2011  Medication Sig Dispense Refill  . aspirin 81 MG tablet Take 81 mg by mouth daily.        Marland Kitchen lisinopril (PRINIVIL,ZESTRIL) 5 MG tablet TAKE ONE TABLET BY MOUTH EVERY DAY.  30 tablet  6  . metoprolol (LOPRESSOR) 50 MG tablet TAKE ONE & ONE-HALF TABLETS BY MOUTH TWICE A DAY.  90 tablet  11  . Multiple Vitamin (MULTIVITAMIN) capsule Take 1 capsule by mouth daily.        . nitroGLYCERIN (NITROSTAT) 0.4 MG SL tablet Take 1 under tongue as needed for chest pain; may repeat every 5 minutes x 2       . NON FORMULARY cpap nightly       . Omega-3 Fatty Acids (FISH OIL) 1000 MG CAPS Take by mouth. Once a day       . prasugrel (EFFIENT) 10 MG TABS Take 1 tablet (10 mg total) by mouth daily.  30 each  5  . rosuvastatin (CRESTOR) 20 MG tablet Take 1 tablet (20 mg total) by mouth daily.  30 tablet  11  . sertraline (ZOLOFT) 25 MG tablet Take 1  tablet (25 mg total) by mouth daily.  30 tablet  11  . Testosterone (ANDROGEL PUMP) 1.25 GM/ACT (1%) GEL Place 3 Squirts onto the skin. On each shoulder daily      . zolpidem (AMBIEN CR) 12.5 MG CR tablet Take 1 tablet by mouth At bedtime as needed.        Pager:  [phone]

## 2011-03-01 NOTE — Patient Instructions (Signed)
Speak with your home care company about your mask tubing Follow up in 6 months

## 2011-03-02 ENCOUNTER — Other Ambulatory Visit: Payer: Self-pay | Admitting: Cardiovascular Disease

## 2011-06-30 ENCOUNTER — Other Ambulatory Visit: Payer: Self-pay | Admitting: Cardiovascular Disease

## 2011-06-30 NOTE — Telephone Encounter (Signed)
..   Requested Prescriptions   Signed Prescriptions Disp Refills  . CRESTOR 20 MG tablet 30 each 5    Sig: TAKE ONE TABLET BY MOUTH DAILY.    Authorizing Provider: [name]    Ordering User: Lacie Scotts

## 2011-08-07 ENCOUNTER — Other Ambulatory Visit: Payer: Self-pay | Admitting: Cardiovascular Disease

## 2011-08-27 ENCOUNTER — Other Ambulatory Visit: Payer: Self-pay | Admitting: Cardiovascular Disease

## 2011-08-28 ENCOUNTER — Encounter: Payer: Self-pay | Admitting: Pulmonary Disease

## 2011-08-28 ENCOUNTER — Ambulatory Visit (INDEPENDENT_AMBULATORY_CARE_PROVIDER_SITE_OTHER): Payer: 59 | Admitting: Pulmonary Disease

## 2011-08-28 VITALS — BP 136/72 | HR 57 | Temp 98.4°F | Ht 70.5 in | Wt 277.6 lb

## 2011-08-28 DIAGNOSIS — G4733 Obstructive sleep apnea (adult) (pediatric): Secondary | ICD-10-CM

## 2011-08-28 NOTE — Telephone Encounter (Signed)
Refilled metoprolol 

## 2011-08-28 NOTE — Patient Instructions (Signed)
Follow-up in one year.

## 2011-08-28 NOTE — Assessment & Plan Note (Signed)
He is compliant with therapy and reports benefit from CPAP.  Advised him of importance of using CPAP whenever he is asleep, including during naps.  Also discussed how he can consolidate his sleep if he desires to do so.  He is to continue ambien CR qhs.

## 2011-08-28 NOTE — Progress Notes (Signed)
Chief Complaint  Patient presents with  . Follow-up    Pt states he wears his cpap machine 5 hrs a night. denies any problems with machine. occasioanl leak from the mask. Pt states he does not sleep enough hours at night so he ends up napping during the day    History of Present Illness: [name] is a 61 y.o. male with OSA using Auto CPAP.  He goes to bed at 11 pm after taking ambien.  He falls asleep quickly.  He wakes up at 4 am, but then can't fall back to sleep.  He will then nap for 1 to 2 hours during the day.  He does not use his CPAP when he naps.  He has nasal mask.  He is not having any trouble with CPAP pressures or mask.   Past Medical History  Diagnosis Date  . Obstructive sleep apnea (adult) (pediatric)     PSG 05/04/10>>AHI 5, REM 19  . Depression   . Hypertension   . Hyperlipemia   . Diabetes mellitus, type 2   . CAD (coronary artery disease)     a. s/p NSTEMI 12.5.11: [place]  w/ promus DES to prox. LAD  B. cath 12.5.11: residulal- D1 80%; AVCFx 50%; pRCA 50%; EF 60%  C) cath 03/16/10; pLAD stent ok; d1 80% (jailed); D2 occluded; CFX 30-40%; pRCA 40, dRCA 40%- no change-med rx    Past Surgical History  Procedure Date  . Heart stent placed 01/18/2010  . Hernia repair     No Known Allergies  Physical Exam:  Blood pressure 136/72, pulse 57, temperature 98.4 F (36.9 C), temperature source Oral, height 5' 10.5" (1.791 m), weight 277 lb 9.6 oz (125.919 kg), SpO2 93.00%. Body mass index is 39.27 kg/(m^2). Wt Readings from Last 2 Encounters:  08/28/11 277 lb 9.6 oz (125.919 kg)  03/01/11 267 lb 3.2 oz (121.201 kg)    General - Obese HEENT - MP 3, no sinus tenderness, no LAN Cardiac - s1s2 regular Chest - no wheeze/rales Abdomen - soft, nontender Extremities - no edema Skin - no rashes Neurologic - normal strength Psychiatric - normal mood, behavior   Assessment/Plan:  Outpatient Encounter Prescriptions as of 08/28/2011  Medication Sig Dispense Refill  .  aspirin 81 MG tablet Take 81 mg by mouth daily.        . CRESTOR 20 MG tablet TAKE ONE TABLET BY MOUTH DAILY.  30 each  5  . EFFIENT 10 MG TABS TAKE ONE TABLET BY MOUTH DAILY.  30 each  4  . lisinopril (PRINIVIL,ZESTRIL) 5 MG tablet TAKE ONE TABLET BY MOUTH EVERY DAY  30 tablet  11  . metoprolol (LOPRESSOR) 50 MG tablet TAKE ONE & ONE-HALF TABLETS BY MOUTH TWICE A DAY.  90 tablet  11  . Multiple Vitamin (MULTIVITAMIN) capsule Take 1 capsule by mouth daily.        . nitroGLYCERIN (NITROSTAT) 0.4 MG SL tablet Take 1 under tongue as needed for chest pain; may repeat every 5 minutes x 2       . NON FORMULARY cpap nightly       . Omega-3 Fatty Acids (FISH OIL) 1000 MG CAPS Take by mouth. Once a day       . sertraline (ZOLOFT) 25 MG tablet Take 1 tablet (25 mg total) by mouth daily.  30 tablet  11  . Testosterone (ANDROGEL PUMP) 1.25 GM/ACT (1%) GEL Place 3 Squirts onto the skin. On each shoulder daily      .  zolpidem (AMBIEN CR) 12.5 MG CR tablet Take 1 tablet by mouth At bedtime as needed.        Coralyn Helling, MD [facility] 08/28/2011, 10:27 AM Pager:  [phone] After 3pm call: [phone]

## 2011-09-08 ENCOUNTER — Other Ambulatory Visit: Payer: Self-pay | Admitting: Cardiovascular Disease

## 2011-10-11 ENCOUNTER — Other Ambulatory Visit: Payer: Self-pay | Admitting: Cardiovascular Disease

## 2011-10-13 ENCOUNTER — Encounter: Payer: Self-pay | Admitting: Cardiovascular Disease

## 2011-10-13 ENCOUNTER — Ambulatory Visit (INDEPENDENT_AMBULATORY_CARE_PROVIDER_SITE_OTHER): Payer: 59 | Admitting: Cardiovascular Disease

## 2011-10-13 VITALS — BP 146/90 | HR 72 | Ht 71.0 in | Wt 274.0 lb

## 2011-10-13 DIAGNOSIS — E785 Hyperlipidemia, unspecified: Secondary | ICD-10-CM

## 2011-10-13 DIAGNOSIS — I10 Essential (primary) hypertension: Secondary | ICD-10-CM

## 2011-10-13 DIAGNOSIS — I2510 Atherosclerotic heart disease of native coronary artery without angina pectoris: Secondary | ICD-10-CM

## 2011-10-13 NOTE — Patient Instructions (Addendum)
Your physician wants you to follow-up in: 6 months with Dr. Cooper.  You will receive a reminder letter in the mail two months in advance. If you don't receive a letter, please call our office to schedule the follow-up appointment.   

## 2011-10-13 NOTE — Assessment & Plan Note (Signed)
Lipids are viewed. LDL at goal, but HDL low. He is willing to consider the accelerate clinical trial.

## 2011-10-13 NOTE — Assessment & Plan Note (Signed)
The patient is stable without anginal symptoms. He is will be on 12 months from his myocardial infarction. I recommended that he can discontinue effient at this point. We had a long discussion about the need for major lifestyle changes and weight loss.

## 2011-10-13 NOTE — Progress Notes (Signed)
   HPI:  61 year old gentleman presenting for followup of coronary artery disease.  The patient has obesity, type 2 diabetes, hypertension, and hyperlipidemia. He has CAD and presented in 2011 with acute coronary syndrome. He had critical stenosis of the proximal LAD and was treated with a drug-eluting stent. He had recurrent chest pain and underwent cardiac catheterization in February that demonstrated wide patency of his stent.  From a symptomatic perspective, the patient is doing well. He denies exertional chest pain or pressure. He denies dyspnea, orthopnea, PND, or edema. He's had no palpitations, lightheadedness, or syncope. He is fully retired from his job. He's gained about 12 pounds since his last visit here. He's not doing regular exercise.  Outpatient Encounter Prescriptions as of 10/13/2011  Medication Sig Dispense Refill  . aspirin 81 MG tablet Take 81 mg by mouth daily.        . CRESTOR 20 MG tablet TAKE ONE TABLET BY MOUTH DAILY.  30 each  5  . EFFIENT 10 MG TABS TAKE ONE TABLET BY MOUTH DAILY.  30 each  4  . lisinopril (PRINIVIL,ZESTRIL) 5 MG tablet TAKE ONE TABLET BY MOUTH EVERY DAY  30 tablet  11  . metoprolol (LOPRESSOR) 50 MG tablet TAKE ONE & ONE-HALF TABLETS BY MOUTH TWICE DAILY  270 tablet  3  . NITROSTAT 0.4 MG SL tablet DISSOLVE ONE TABLET UNDER THE TONGUE EVERY 5 MINUTES AS NEEDED UP TO 3 DOSES  25 each  2  . NON FORMULARY cpap nightly       . Omega-3 Fatty Acids (FISH OIL) 1000 MG CAPS Take by mouth. Once a day       . sertraline (ZOLOFT) 25 MG tablet TAKE ONE TABLET BY MOUTH EVERY DAY  30 tablet  3  . Testosterone (ANDROGEL PUMP) 1.25 GM/ACT (1%) GEL Place 3 Squirts onto the skin. On each shoulder daily      . zolpidem (AMBIEN CR) 12.5 MG CR tablet Take 1 tablet by mouth At bedtime as needed.      Marland Kitchen DISCONTD: Multiple Vitamin (MULTIVITAMIN) capsule Take 1 capsule by mouth daily.          No Known Allergies  Past Medical History  Diagnosis Date  . Obstructive sleep  apnea (adult) (pediatric)     PSG 05/04/10>>AHI 5, REM 19  . Depression   . Hypertension   . Hyperlipemia   . Diabetes mellitus, type 2   . CAD (coronary artery disease)     a. s/p NSTEMI 12.5.11: [place]  w/ promus DES to prox. LAD  B. cath 12.5.11: residulal- D1 80%; AVCFx 50%; pRCA 50%; EF 60%  C) cath 03/16/10; pLAD stent ok; d1 80% (jailed); D2 occluded; CFX 30-40%; pRCA 40, dRCA 40%- no change-med rx    ROS: Negative except as per HPI  BP 146/90  Pulse 72  Ht 5\' 11"  (1.803 m)  Wt 124.286 kg (274 lb)  BMI 38.22 kg/m2  PHYSICAL EXAM: Pt is alert and oriented, pleasant, obese male in NAD HEENT: normal Neck: JVP - normal, carotids 2+= without bruits Lungs: CTA bilaterally CV: RRR without murmur or gallop Abd: soft, NT, Positive BS, no hepatomegaly Ext: no C/C/E, distal pulses intact and equal Skin: warm/dry no rash  EKG:  Normal sinus rhythm 72 beats per minute, within normal limits.  ASSESSMENT AND PLAN:

## 2011-10-13 NOTE — Assessment & Plan Note (Signed)
Suboptimal blood pressure control. Recommend increase lisinopril to 10 mg daily and increase metoprolol to 100 mg twice daily.

## 2011-10-17 ENCOUNTER — Telehealth: Payer: Self-pay | Admitting: Cardiovascular Disease

## 2011-10-17 MED ORDER — METOPROLOL TARTRATE 100 MG PO TABS: 100.0000 mg | ORAL_TABLET | Freq: Two times a day (BID) | ORAL | Status: DC

## 2011-10-17 MED ORDER — LISINOPRIL 10 MG PO TABS: 10.0000 mg | ORAL_TABLET | Freq: Every day | ORAL | Status: AC

## 2011-10-17 NOTE — Addendum Note (Signed)
Addended by: Iona Coach on: 10/17/2011 03:14 PM   Modules accepted: Orders

## 2011-10-17 NOTE — Telephone Encounter (Signed)
Pt was to get a BP med due to high BP, no med at the pharmacy, [facility], wife [phone]

## 2011-10-17 NOTE — Telephone Encounter (Signed)
ESSENTIAL HYPERTENSION, BENIGN - [name], MD 10/13/2011 3:55 PM Signed  Suboptimal blood pressure control. Recommend increase lisinopril to 10 mg daily and increase metoprolol to 100 mg twice daily.

## 2011-10-17 NOTE — Telephone Encounter (Signed)
Left message on the [name] voicemail with instructions for medication change.

## 2011-12-13 ENCOUNTER — Telehealth: Payer: Self-pay | Admitting: Cardiovascular Disease

## 2011-12-13 NOTE — Telephone Encounter (Signed)
plz return call to patient [phone]  Pt has moved to [place] and would like Excell Seltzer to refer them to a good doctor in the [place] or [place] area.

## 2011-12-13 NOTE — Telephone Encounter (Signed)
Will forward to Dr Excell Seltzer for recommendations if he knows anyone in that area.

## 2011-12-15 NOTE — Telephone Encounter (Signed)
Left message to call back  

## 2011-12-15 NOTE — Telephone Encounter (Signed)
I'm sorry but I don't know anyone in that area. Please wish him well.

## 2011-12-21 NOTE — Telephone Encounter (Signed)
I spoke with the pt's wife and made her aware that Dr Excell Seltzer does not know anyone in that area.

## 2012-07-25 ENCOUNTER — Telehealth: Payer: Self-pay | Admitting: Pulmonary Disease

## 2012-07-25 NOTE — Telephone Encounter (Signed)
Called and spoke to pt's wife on 07/25/12.  Called to make next ov per recall.  Pt's wife stated they have moved to [place] and will not be coming here for appts any longer. [name]

## 2012-09-21 IMAGING — CR DG CHEST 1V
1 series · 1 of 1 positions shown · non-contrast
Comparison: None.

CLINICAL DATA: Chest pain.

CHEST - 1 VIEW

[w chest pa]
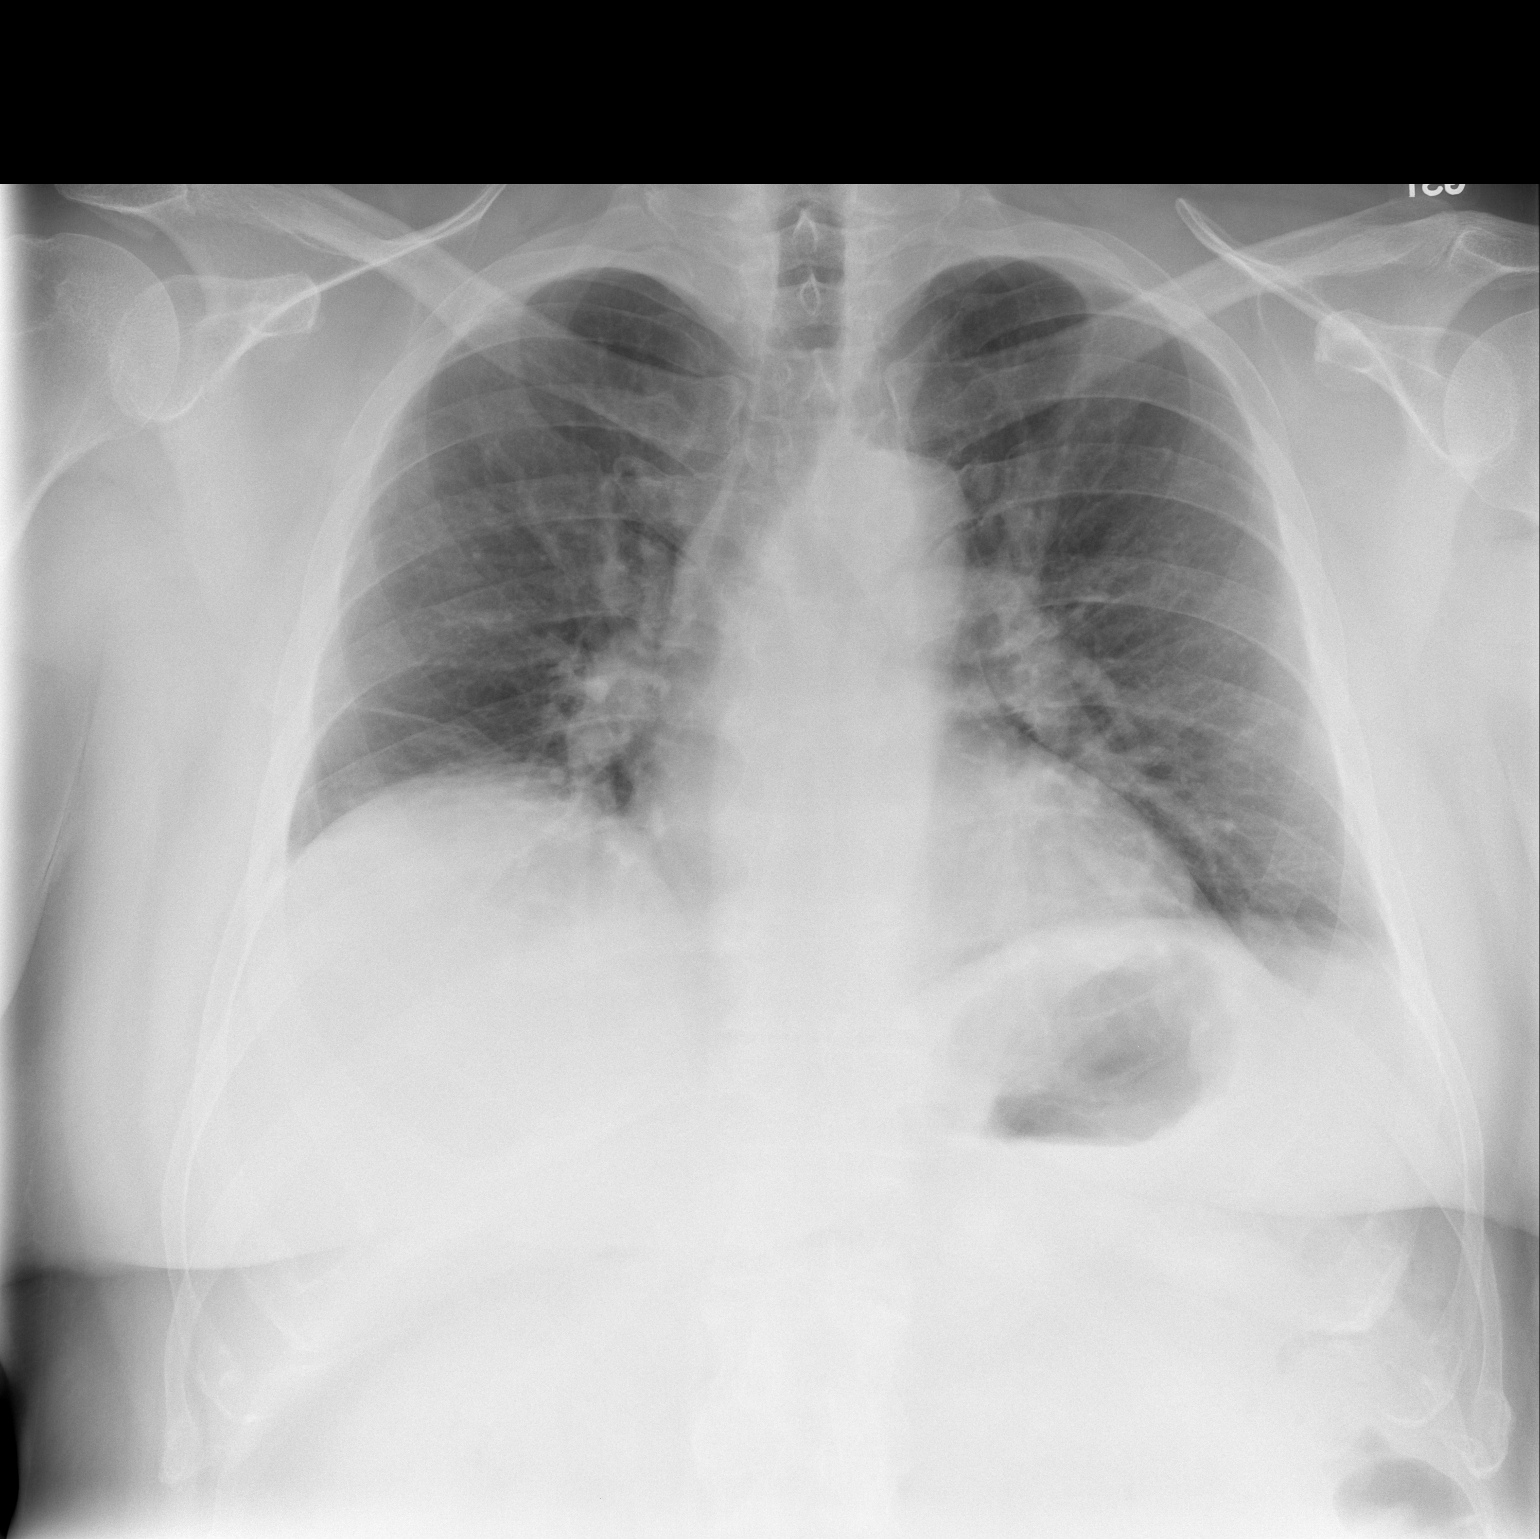

[1 of 1 positions shown; findings below may reference images not displayed]

FINDINGS: Low lung volumes are present, with linear opacities most
compatible with atelectasis in the lower lobes.  Heart size is
within normal limits, accounting for the low lung volumes.  No
discrete pleural effusion identified.
IMPRESSION: 1.  Low lung volumes with subsegmental atelectasis in the lower
lobes.

## 2012-10-27 ENCOUNTER — Other Ambulatory Visit: Payer: Self-pay | Admitting: Cardiovascular Disease

## 2012-11-16 IMAGING — CR DG CHEST 2V
2 series · 2 of 2 positions shown · non-contrast
Comparison: 01/18/2010

CLINICAL DATA: Chest pain.  Shortness of breath.  Cold and
congestion.

CHEST - 2 VIEW

[w chest pa]
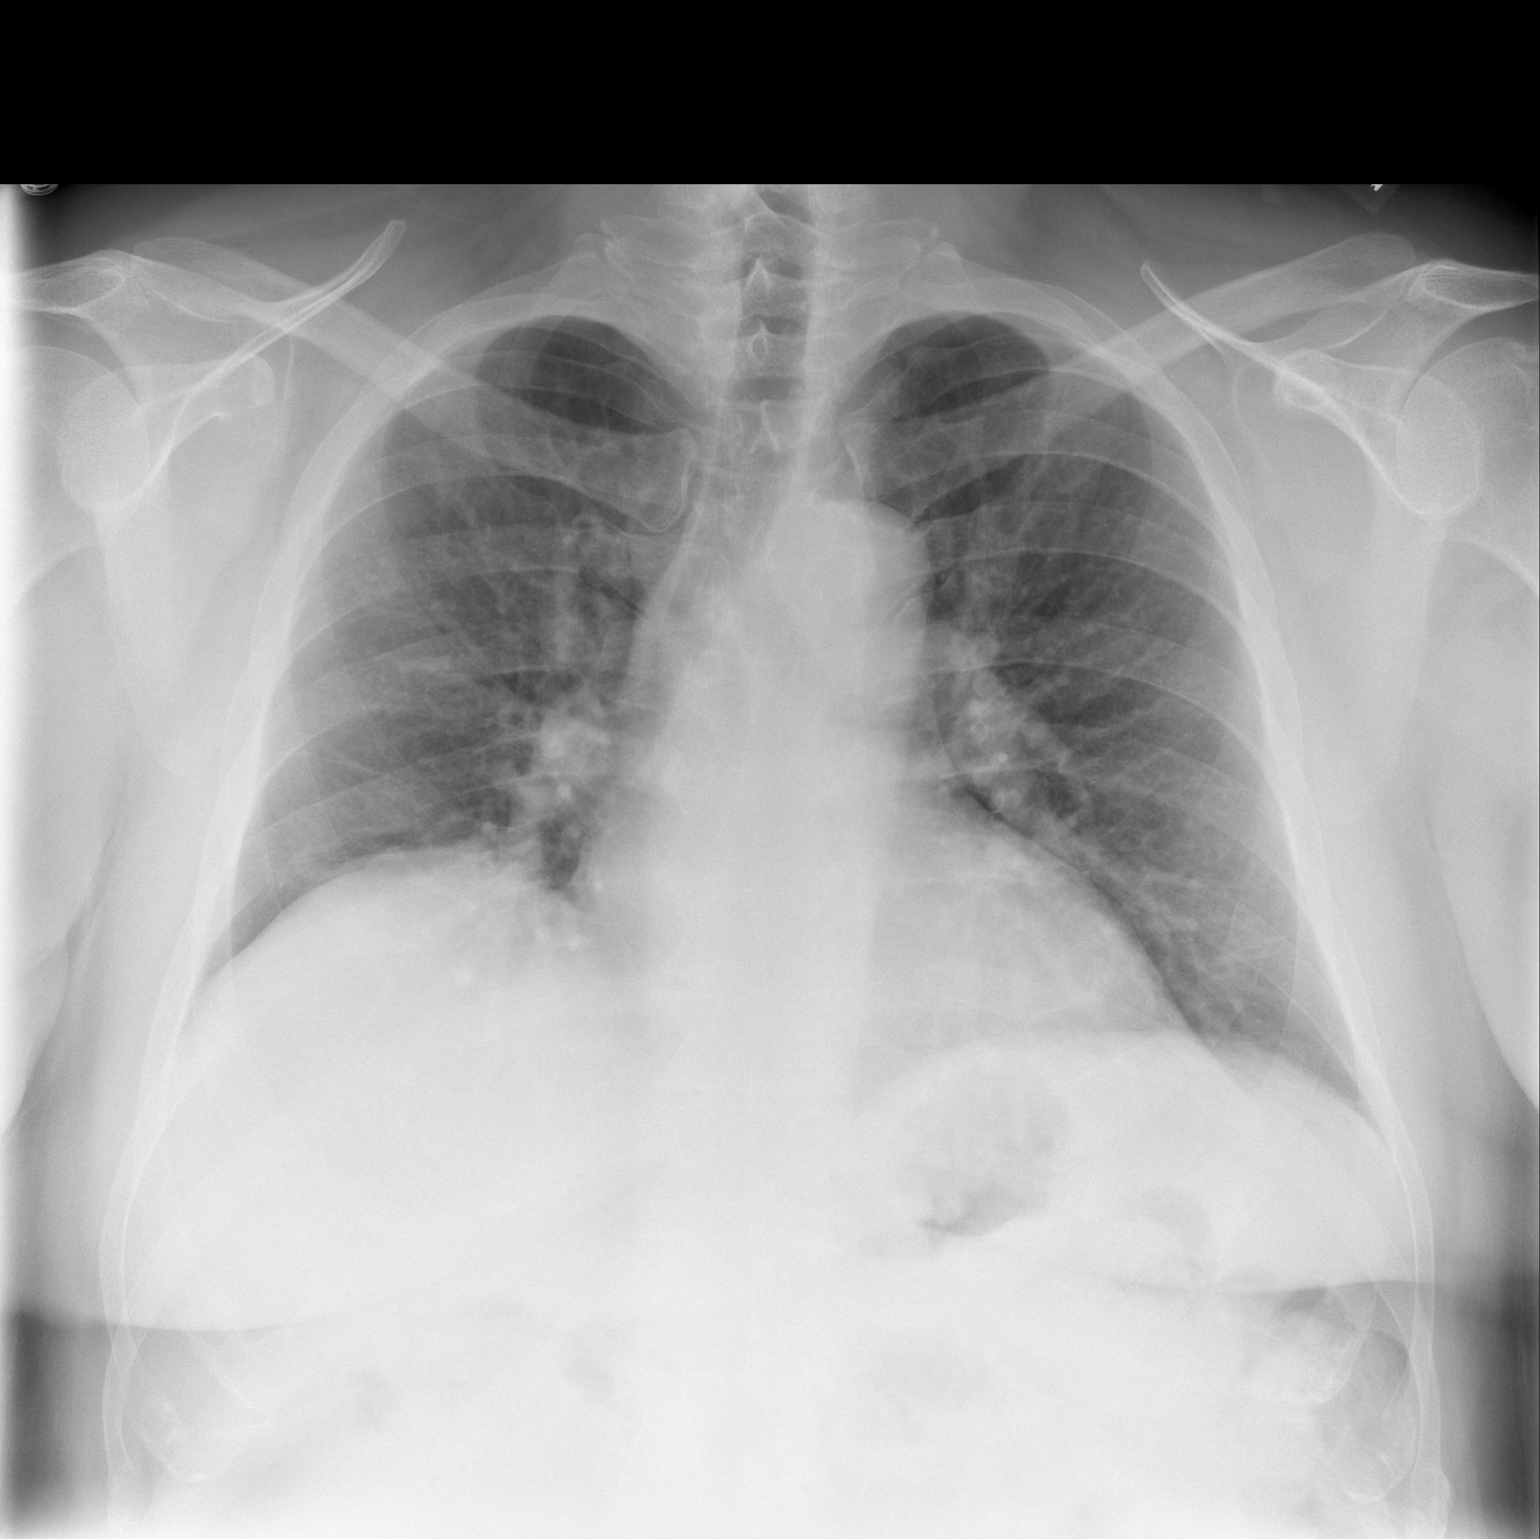

[w chest lat]
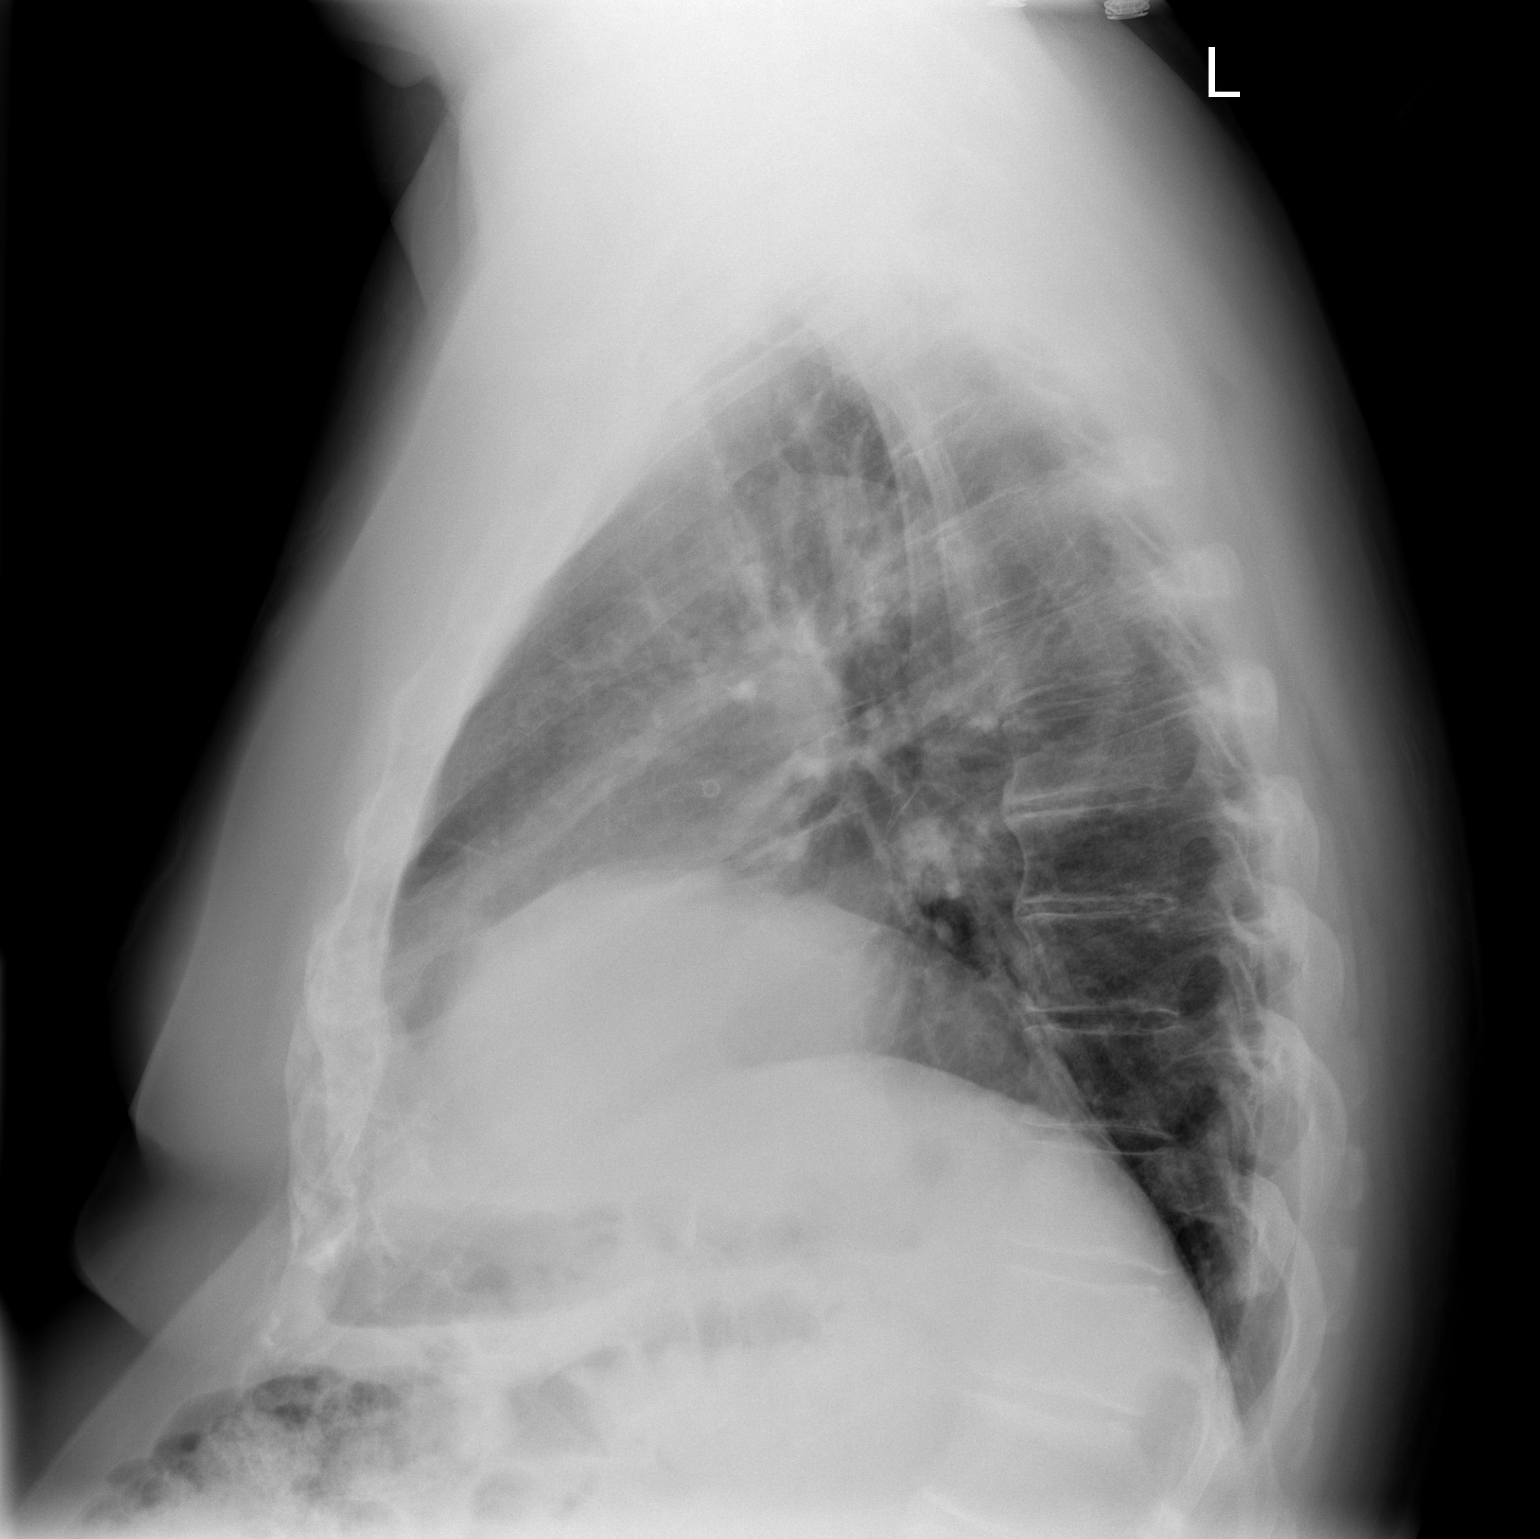

[2 of 2 positions shown; findings below may reference images not displayed]

FINDINGS: Heart size is normal.

No pleural effusion or pulmonary edema.

Asymmetric elevation of the right hemidiaphragm identified.  No
airspace consolidation identified.
IMPRESSION: 1.  No active cardiopulmonary abnormalities.
2.  Asymmetric elevation right hemidiaphragm, similar to previous
exam.

## 2014-01-26 ENCOUNTER — Other Ambulatory Visit (HOSPITAL_COMMUNITY): Payer: Self-pay | Admitting: Cardiovascular Disease

## 2014-01-26 NOTE — Telephone Encounter (Signed)
Please authorize a 30 day supply of this medication with no refills.  Please include no future refills from Dr Excell Seltzerooper since the pt has moved to [place].

## 2014-01-26 NOTE — Telephone Encounter (Signed)
Please advise on refill. Patient has not been seen here since 2013, and patient has moved to [place]. Thanks, MI

## 2018-03-12 ENCOUNTER — Telehealth: Payer: Self-pay | Admitting: Cardiovascular Disease

## 2018-03-12 NOTE — Telephone Encounter (Signed)
After reviewing pt's records appears pt had another cath on Feb 1,2012 and at that time had non st elevated MI. Pt aware .[name]

## 2018-03-12 NOTE — Telephone Encounter (Signed)
New message   Per after hours answer phone message, the patient wants to know if he had an MI when his stint was put in?
